# Patient Record
Sex: Female | Born: 1985 | Race: White | Hispanic: No | Marital: Married | Smoking: Never smoker
Health system: Southern US, Community
[De-identification: ages and names within clinical notes are randomized; demographics above are authoritative.]

## PROBLEM LIST (undated history)

## (undated) ENCOUNTER — Inpatient Hospital Stay (HOSPITAL_COMMUNITY): Payer: Self-pay

## (undated) DIAGNOSIS — Z9289 Personal history of other medical treatment: Secondary | ICD-10-CM

## (undated) DIAGNOSIS — J45909 Unspecified asthma, uncomplicated: Secondary | ICD-10-CM

## (undated) DIAGNOSIS — K219 Gastro-esophageal reflux disease without esophagitis: Secondary | ICD-10-CM

## (undated) DIAGNOSIS — E119 Type 2 diabetes mellitus without complications: Secondary | ICD-10-CM

## (undated) DIAGNOSIS — R011 Cardiac murmur, unspecified: Secondary | ICD-10-CM

## (undated) DIAGNOSIS — R51 Headache: Secondary | ICD-10-CM

## (undated) DIAGNOSIS — N189 Chronic kidney disease, unspecified: Secondary | ICD-10-CM

---
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
## 2012-03-27 LAB — OB RESULTS CONSOLE RPR: RPR: NONREACTIVE

## 2012-03-27 LAB — OB RESULTS CONSOLE HIV ANTIBODY (ROUTINE TESTING): HIV: NONREACTIVE

## 2012-03-27 LAB — OB RESULTS CONSOLE ABO/RH: RH Type: NEGATIVE

## 2012-04-09 NOTE — L&D Delivery Note (Signed)
Delivery Note  SVD viable female Apgars 9,9 over 2nd degree ml lac.  Placenta delivered spontaneously intact with 3VC. Repair with 2-0 and 3-0  Chromic with good support and hemostasis noted and R/V exam confirms.  PH art was sent.  [place] cord blood was not done.  Mother and baby were doing well.  EBL 350cc  Candice Camp, MD

## 2012-09-14 ENCOUNTER — Inpatient Hospital Stay (HOSPITAL_COMMUNITY)
Admission: AD | Admit: 2012-09-14 | Discharge: 2012-09-15 | Disposition: A | Discharge status: Home / Self Care | Payer: Managed Care, Other (non HMO) | Source: Ambulatory Visit | Attending: Obstetrics and Gynecology | Admitting: Obstetrics and Gynecology

## 2012-09-14 ENCOUNTER — Encounter (HOSPITAL_COMMUNITY): Payer: Self-pay | Admitting: *Deleted

## 2012-09-14 ENCOUNTER — Inpatient Hospital Stay (HOSPITAL_COMMUNITY): Payer: Managed Care, Other (non HMO)

## 2012-09-14 DIAGNOSIS — O99891 Other specified diseases and conditions complicating pregnancy: Secondary | ICD-10-CM | POA: Insufficient documentation

## 2012-09-14 DIAGNOSIS — N200 Calculus of kidney: Secondary | ICD-10-CM

## 2012-09-14 DIAGNOSIS — R109 Unspecified abdominal pain: Secondary | ICD-10-CM | POA: Insufficient documentation

## 2012-09-14 LAB — COMPREHENSIVE METABOLIC PANEL
Albumin: 2.5 g/dL — ABNORMAL LOW (ref 3.5–5.2)
Alkaline Phosphatase: 100 U/L (ref 39–117)
BUN: 9 mg/dL (ref 6–23)
CO2: 23 mEq/L (ref 19–32)
Chloride: 106 mEq/L (ref 96–112)
GFR calc non Af Amer: >90 mL/min (ref >90)
Potassium: 3.7 mEq/L (ref 3.5–5.1)
Total Bilirubin: 0.4 mg/dL (ref 0.3–1.2)

## 2012-09-14 LAB — URINALYSIS, ROUTINE W REFLEX MICROSCOPIC
Glucose, UA: NEGATIVE mg/dL
Hgb urine dipstick: NEGATIVE
Leukocytes, UA: NEGATIVE
Specific Gravity, Urine: 1.01 (ref 1.005–1.030)
pH: 6.5 (ref 5.0–8.0)

## 2012-09-14 LAB — CBC
HCT: 35.9 % — ABNORMAL LOW (ref 36.0–46.0)
Hemoglobin: 12.4 g/dL (ref 12.0–15.0)
MCV: 83.5 fL (ref 78.0–100.0)
RBC: 4.3 MIL/uL (ref 3.87–5.11)
RDW: 13.5 % (ref 11.5–15.5)
WBC: 11.1 10*3/uL — ABNORMAL HIGH (ref 4.0–10.5)

## 2012-09-14 MED ORDER — OXYCODONE-ACETAMINOPHEN 5-325 MG PO TABS: 1.0000 | ORAL_TABLET | ORAL | Status: DC | PRN

## 2012-09-14 MED ORDER — OXYCODONE-ACETAMINOPHEN 5-325 MG PO TABS
2.0000 | ORAL_TABLET | Freq: Once | ORAL | Status: AC
  Administered 2012-09-14: 2 via ORAL
  Filled 2012-09-14: qty 2

## 2012-09-14 MED ORDER — PROMETHAZINE HCL 25 MG PO TABS
12.5000 mg | ORAL_TABLET | Freq: Once | ORAL | Status: AC
  Administered 2012-09-14: 12.5 mg via ORAL
  Filled 2012-09-14: qty 1

## 2012-09-14 MED ORDER — FENTANYL CITRATE 0.05 MG/ML IJ SOLN
100.0000 ug | Freq: Once | INTRAMUSCULAR | Status: AC
  Administered 2012-09-14: 100 ug via INTRAMUSCULAR
  Filled 2012-09-14 (×2): qty 2

## 2012-09-14 MED ORDER — PROMETHAZINE HCL 12.5 MG PO TABS: 25.0000 mg | ORAL_TABLET | Freq: Four times a day (QID) | ORAL | Status: DC | PRN

## 2012-09-14 NOTE — MAU Provider Note (Signed)
History     CSN: [id number]  Arrival date and time: 09/14/12 2006   First Provider Initiated Contact with Patient 09/14/12 2045      Chief Complaint  Patient presents with  . Flank Pain  . Contractions   HPI  Judy Hart is a 27 y.o. G2P1 at [redacted]w[redacted]d who presents today with Right flank pain. She states that she had a kidney stone with her last pregnancy, and this pain is very similar. She also thinks she is having some contractions. She denies any LOF or VB and confirms fetal movement.    No past medical history on file.  No past surgical history on file.  No family history on file.  History  Substance Use Topics  . Smoking status: Not on file  . Smokeless tobacco: Not on file  . Alcohol Use: Not on file    Allergies: Allergies not on file  No prescriptions prior to admission    Review of Systems  Constitutional: Negative for fever and chills.  Eyes: Negative for blurred vision.  Respiratory: Negative for shortness of breath.   Cardiovascular: Negative for chest pain.  Gastrointestinal: Positive for nausea. Negative for vomiting, abdominal pain, diarrhea and constipation.  Genitourinary: Positive for flank pain. Negative for dysuria, urgency, frequency and hematuria.  Musculoskeletal: Negative for myalgias.  Neurological: Negative for headaches.   Physical Exam   Blood pressure 107/59, pulse 112, temperature 98.4 F (36.9 C), temperature source Oral, resp. rate 20, height 5\' 3"  (1.6 m), weight 69.4 kg (153 lb), SpO2 100.00%.  Physical Exam  Nursing note and vitals reviewed. Constitutional: She is oriented to person, place, and time. She appears well-developed and well-nourished. No distress.  Cardiovascular: Normal rate.   Respiratory: Effort normal.  GI: Soft. There is no tenderness.  Genitourinary:   No CVA tenderness External: no lesion Vagina: small amount of white discharge Cervix: pink, smooth, Closed/thick/high Uterus: AGA  Neurological: She is  alert and oriented to person, place, and time.  Skin: Skin is warm and dry.  Psychiatric: She has a normal mood and affect.    NST: FHT: Judy Hart Toco: some UI MAU Course  Procedures  Results for orders placed during the hospital encounter of 09/14/12 (from the past 24 hour(s))  URINALYSIS, ROUTINE W REFLEX MICROSCOPIC     Status: None   Collection Time    09/14/12  8:20 PM      Result Value Range   Color, Urine YELLOW  YELLOW   APPearance CLEAR  CLEAR   Specific Gravity, Urine 1.010  1.005 - 1.030   pH 6.5  5.0 - 8.0   Glucose, UA NEGATIVE  NEGATIVE mg/dL   Hgb urine dipstick NEGATIVE  NEGATIVE   Bilirubin Urine NEGATIVE  NEGATIVE   Ketones, ur NEGATIVE  NEGATIVE mg/dL   Protein, ur NEGATIVE  NEGATIVE mg/dL   Urobilinogen, UA 0.2  0.0 - 1.0 mg/dL   Nitrite NEGATIVE  NEGATIVE   Leukocytes, UA NEGATIVE  NEGATIVE  CBC     Status: Abnormal   Collection Time    09/14/12  9:00 PM      Result Value Range   WBC 11.1 (*) 4.0 - 10.5 K/uL   RBC 4.30  3.87 - 5.11 MIL/uL   Hemoglobin 12.4  12.0 - 15.0 g/dL   HCT 40.9 (*) 81.1 - 91.4 %   MCV 83.5  78.0 - 100.0 fL   MCH 28.8  26.0 - 34.0 pg   MCHC 34.5  30.0 - 36.0 g/dL  RDW 13.5  11.5 - 15.5 %   Platelets 179  150 - 400 K/uL  COMPREHENSIVE METABOLIC PANEL     Status: Abnormal   Collection Time    09/14/12  9:00 PM      Result Value Range   Sodium 138  135 - 145 mEq/L   Potassium 3.7  3.5 - 5.1 mEq/L   Chloride 106  96 - 112 mEq/L   CO2 23  19 - 32 mEq/L   Glucose, Bld 83  70 - 99 mg/dL   BUN 9  6 - 23 mg/dL   Creatinine, Ser 9.62  0.50 - 1.10 mg/dL   Calcium 9.1  8.4 - 95.2 mg/dL   Total Protein 5.9 (*) 6.0 - 8.3 g/dL   Albumin 2.5 (*) 3.5 - 5.2 g/dL   AST 28  0 - 37 U/L   ALT 43 (*) 0 - 35 U/L   Alkaline Phosphatase 100  39 - 117 U/L   Total Bilirubin 0.4  0.3 - 1.2 mg/dL   GFR calc non Af Amer >90  >90 mL/min   GFR calc Af Amer >90  >90 mL/min   *RADIOLOGY REPORT*  Clinical Data: Neck pain. History of kidney stones.  [redacted] weeks  pregnant.  RENAL/URINARY TRACT ULTRASOUND COMPLETE  Comparison: None.  Findings:  Right Kidney: Right kidney measures 13.4 cm length. There is  moderate right renal hydronephrosis. The proximal ureter is  dilated but the distal ureter is not visualized. The point of  obstruction is not identified. No specific stones are visualized.  No solid mass lesions.  Left Kidney: Normal in size and parenchymal echogenicity. No  evidence of mass or hydronephrosis.  Bladder: Limited visualization due to the pregnancy. No specific  abnormality identified as visualized.  IMPRESSION:  A nonspecific moderate right renal hydronephrosis. No stones  identified. Changes could be due to compression by the pregnancy  or to a non visualized obstructing stone or lesion.  Original Report Authenticated By: Burman Nieves, M.D.  2216: Spoke with Dr. Renaldo Fiddler, will try 2 percocet PO here. If unable to control pain with PO meds will admit for pain control. 2345: Patient reports that pain is adequately controlled with the percocet. Desires DC home with pain meds.  Assessment and Plan   1. Kidney stone complicating pregnancy, third trimester    RX: percocet 5/325 1-2 Q4-6hours PRN Phergan 25mg  PO q6 hours PRN Return to MAU if pain worsens or is not helped with meds Strain urine Increase PO hydration 3rd trimester danger signs reviewed FU with OB as scheduled.   Tawnya Crook 09/14/2012, 8:51 PM

## 2012-09-14 NOTE — MAU Note (Signed)
Pt reports rt flank pain since this am, h/o kidney stones. Vomiting. Contractions.

## 2012-09-19 ENCOUNTER — Ambulatory Visit (HOSPITAL_COMMUNITY): Payer: Managed Care, Other (non HMO) | Admitting: Anesthesiology

## 2012-09-19 ENCOUNTER — Other Ambulatory Visit: Payer: Self-pay | Admitting: Urology

## 2012-09-19 ENCOUNTER — Encounter (HOSPITAL_COMMUNITY): Payer: Self-pay | Admitting: Anesthesiology

## 2012-09-19 ENCOUNTER — Encounter (HOSPITAL_COMMUNITY)
Admission: AD | Disposition: A | Discharge status: Home / Self Care | Payer: Self-pay | Source: Ambulatory Visit | Attending: Obstetrics and Gynecology

## 2012-09-19 ENCOUNTER — Observation Stay (HOSPITAL_COMMUNITY)
Admission: AD | Admit: 2012-09-19 | Discharge: 2012-09-21 | Disposition: A | Discharge status: Home / Self Care | Payer: Managed Care, Other (non HMO) | Source: Ambulatory Visit | Attending: Obstetrics and Gynecology | Admitting: Obstetrics and Gynecology

## 2012-09-19 ENCOUNTER — Encounter (HOSPITAL_COMMUNITY): Payer: Self-pay | Admitting: *Deleted

## 2012-09-19 DIAGNOSIS — O26839 Pregnancy related renal disease, unspecified trimester: Principal | ICD-10-CM | POA: Insufficient documentation

## 2012-09-19 DIAGNOSIS — N200 Calculus of kidney: Secondary | ICD-10-CM | POA: Insufficient documentation

## 2012-09-19 DIAGNOSIS — O4700 False labor before 37 completed weeks of gestation, unspecified trimester: Secondary | ICD-10-CM | POA: Insufficient documentation

## 2012-09-19 DIAGNOSIS — N1330 Unspecified hydronephrosis: Secondary | ICD-10-CM | POA: Insufficient documentation

## 2012-09-19 DIAGNOSIS — N135 Crossing vessel and stricture of ureter without hydronephrosis: Secondary | ICD-10-CM | POA: Insufficient documentation

## 2012-09-19 DIAGNOSIS — R109 Unspecified abdominal pain: Secondary | ICD-10-CM | POA: Insufficient documentation

## 2012-09-19 HISTORY — DX: Headache: R51

## 2012-09-19 HISTORY — DX: Gastro-esophageal reflux disease without esophagitis: K21.9

## 2012-09-19 HISTORY — DX: Type 2 diabetes mellitus without complications: E11.9

## 2012-09-19 HISTORY — PX: CYSTOSCOPY W/ URETERAL STENT PLACEMENT: SHX1429

## 2012-09-19 HISTORY — DX: Chronic kidney disease, unspecified: N18.9

## 2012-09-19 HISTORY — DX: Unspecified asthma, uncomplicated: J45.909

## 2012-09-19 HISTORY — DX: Cardiac murmur, unspecified: R01.1

## 2012-09-19 LAB — COMPREHENSIVE METABOLIC PANEL
ALT: 26 U/L (ref 0–35)
Alkaline Phosphatase: 129 U/L — ABNORMAL HIGH (ref 39–117)
BUN: 6 mg/dL (ref 6–23)
CO2: 23 mEq/L (ref 19–32)
GFR calc Af Amer: >90 mL/min (ref >90)
GFR calc non Af Amer: >90 mL/min (ref >90)
Glucose, Bld: 137 mg/dL — ABNORMAL HIGH (ref 70–99)
Potassium: 3.6 mEq/L (ref 3.5–5.1)
Sodium: 136 mEq/L (ref 135–145)
Total Bilirubin: 0.7 mg/dL (ref 0.3–1.2)
Total Protein: 5.7 g/dL — ABNORMAL LOW (ref 6.0–8.3)

## 2012-09-19 LAB — CBC
HCT: 37.3 % (ref 36.0–46.0)
Hemoglobin: 11.1 g/dL — ABNORMAL LOW (ref 12.0–15.0)
MCHC: 29.8 g/dL — ABNORMAL LOW (ref 30.0–36.0)
RBC: 3.96 MIL/uL (ref 3.87–5.11)

## 2012-09-19 LAB — SURGICAL PCR SCREEN: MRSA, PCR: NEGATIVE

## 2012-09-19 SURGERY — CYSTOSCOPY, WITH RETROGRADE PYELOGRAM AND URETERAL STENT INSERTION
Anesthesia: General | Site: Ureter | Laterality: Right | Wound class: Clean Contaminated

## 2012-09-19 MED ORDER — LACTATED RINGERS IV SOLN: INTRAVENOUS | Status: DC

## 2012-09-19 MED ORDER — CYCLOBENZAPRINE HCL 10 MG PO TABS: 10.0000 mg | ORAL_TABLET | Freq: Three times a day (TID) | ORAL | Status: DC | PRN

## 2012-09-19 MED ORDER — LACTATED RINGERS IV SOLN
INTRAVENOUS | Status: DC | PRN
  Administered 2012-09-19: 19:00:00 via INTRAVENOUS

## 2012-09-19 MED ORDER — MUPIROCIN 2 % EX OINT
TOPICAL_OINTMENT | Freq: Two times a day (BID) | CUTANEOUS | Status: DC
  Administered 2012-09-19: 18:00:00 via NASAL
  Filled 2012-09-19: qty 22

## 2012-09-19 MED ORDER — MAGNESIUM SULFATE 40 G IN LACTATED RINGERS - SIMPLE
2.0000 g/h | INTRAVENOUS | Status: DC
  Filled 2012-09-19: qty 500

## 2012-09-19 MED ORDER — DEXAMETHASONE SODIUM PHOSPHATE 10 MG/ML IJ SOLN
INTRAMUSCULAR | Status: DC | PRN
  Administered 2012-09-19: 10 mg via INTRAVENOUS

## 2012-09-19 MED ORDER — ZOLPIDEM TARTRATE 5 MG PO TABS: 5.0000 mg | ORAL_TABLET | Freq: Every evening | ORAL | Status: DC | PRN

## 2012-09-19 MED ORDER — LACTATED RINGERS IV SOLN
INTRAVENOUS | Status: DC
  Administered 2012-09-20 – 2012-09-21 (×4): via INTRAVENOUS

## 2012-09-19 MED ORDER — 0.9 % SODIUM CHLORIDE (POUR BTL) OPTIME
TOPICAL | Status: DC | PRN
  Administered 2012-09-19: 1000 mL

## 2012-09-19 MED ORDER — CALCIUM CARBONATE ANTACID 500 MG PO CHEW: 2.0000 | CHEWABLE_TABLET | ORAL | Status: DC | PRN

## 2012-09-19 MED ORDER — CEFAZOLIN SODIUM-DEXTROSE 2-3 GM-% IV SOLR
INTRAVENOUS | Status: AC
  Filled 2012-09-19: qty 50

## 2012-09-19 MED ORDER — SUCCINYLCHOLINE CHLORIDE 20 MG/ML IJ SOLN
INTRAMUSCULAR | Status: DC | PRN
  Administered 2012-09-19: 120 mg via INTRAVENOUS

## 2012-09-19 MED ORDER — LIDOCAINE HCL (CARDIAC) 20 MG/ML IV SOLN
INTRAVENOUS | Status: DC | PRN
  Administered 2012-09-19: 50 mg via INTRAVENOUS

## 2012-09-19 MED ORDER — LIDOCAINE HCL 2 % EX GEL
CUTANEOUS | Status: AC
  Filled 2012-09-19: qty 10

## 2012-09-19 MED ORDER — IOHEXOL 300 MG/ML  SOLN
INTRAMUSCULAR | Status: AC
  Filled 2012-09-19: qty 1

## 2012-09-19 MED ORDER — SODIUM CHLORIDE 0.9 % IR SOLN
Status: DC | PRN
  Administered 2012-09-19: 3000 mL

## 2012-09-19 MED ORDER — CITRIC ACID-SODIUM CITRATE 334-500 MG/5ML PO SOLN
ORAL | Status: AC
  Filled 2012-09-19: qty 15

## 2012-09-19 MED ORDER — ACETAMINOPHEN 325 MG PO TABS
650.0000 mg | ORAL_TABLET | ORAL | Status: DC | PRN
  Administered 2012-09-20 (×2): 650 mg via ORAL
  Filled 2012-09-19 (×2): qty 2

## 2012-09-19 MED ORDER — CEFAZOLIN SODIUM 1-5 GM-% IV SOLN
1.0000 g | INTRAVENOUS | Status: AC
  Administered 2012-09-19: 2 g via INTRAVENOUS

## 2012-09-19 MED ORDER — FLUTICASONE PROPIONATE 50 MCG/ACT NA SUSP
2.0000 | Freq: Every day | NASAL | Status: DC
  Administered 2012-09-20 – 2012-09-21 (×2): 2 via NASAL
  Filled 2012-09-19: qty 16

## 2012-09-19 MED ORDER — DOCUSATE SODIUM 100 MG PO CAPS
100.0000 mg | ORAL_CAPSULE | Freq: Every day | ORAL | Status: DC
  Administered 2012-09-20: 100 mg via ORAL
  Filled 2012-09-19 (×2): qty 1

## 2012-09-19 MED ORDER — MAGNESIUM SULFATE BOLUS VIA INFUSION
4.0000 g | Freq: Once | INTRAVENOUS | Status: AC
  Administered 2012-09-19: 4 g via INTRAVENOUS
  Filled 2012-09-19: qty 500

## 2012-09-19 MED ORDER — FENTANYL CITRATE 0.05 MG/ML IJ SOLN
INTRAMUSCULAR | Status: DC | PRN
  Administered 2012-09-19: 50 ug via INTRAVENOUS
  Administered 2012-09-19 (×2): 100 ug via INTRAVENOUS

## 2012-09-19 MED ORDER — PRENATAL MULTIVITAMIN CH
1.0000 | ORAL_TABLET | Freq: Every day | ORAL | Status: DC
  Administered 2012-09-20 – 2012-09-21 (×2): 1 via ORAL
  Filled 2012-09-19 (×2): qty 1

## 2012-09-19 MED ORDER — PROPOFOL 10 MG/ML IV BOLUS
INTRAVENOUS | Status: DC | PRN
  Administered 2012-09-19: 200 mg via INTRAVENOUS

## 2012-09-19 MED ORDER — CITRIC ACID-SODIUM CITRATE 334-500 MG/5ML PO SOLN
ORAL | Status: DC | PRN
  Administered 2012-09-19: 30 mL via ORAL

## 2012-09-19 MED ORDER — FAMOTIDINE 20 MG PO TABS
20.0000 mg | ORAL_TABLET | Freq: Two times a day (BID) | ORAL | Status: DC
  Administered 2012-09-19 – 2012-09-20 (×2): 20 mg via ORAL
  Filled 2012-09-19 (×4): qty 1

## 2012-09-19 MED ORDER — ONDANSETRON HCL 4 MG/2ML IJ SOLN
INTRAMUSCULAR | Status: DC | PRN
  Administered 2012-09-19: 4 mg via INTRAVENOUS

## 2012-09-19 MED ORDER — TERBUTALINE SULFATE 1 MG/ML IJ SOLN
0.2500 mg | Freq: Once | INTRAMUSCULAR | Status: AC
  Administered 2012-09-19: 0.25 mg via SUBCUTANEOUS
  Filled 2012-09-19: qty 1

## 2012-09-19 MED ORDER — OXYCODONE-ACETAMINOPHEN 5-325 MG PO TABS: 1.0000 | ORAL_TABLET | ORAL | Status: DC | PRN

## 2012-09-19 MED ORDER — AMPICILLIN SODIUM 2 G IJ SOLR
2.0000 g | Freq: Four times a day (QID) | INTRAMUSCULAR | Status: DC
  Administered 2012-09-19 – 2012-09-21 (×6): 2 g via INTRAVENOUS
  Filled 2012-09-19 (×9): qty 2000

## 2012-09-19 SURGICAL SUPPLY — 20 items
ADAPTER CATH URET PLST 4-6FR (CATHETERS) IMPLANT
BAG URO CATCHER STRL LF (DRAPE) ×2 IMPLANT
CATH INTERMIT  6FR 70CM (CATHETERS) IMPLANT
CATH URET 5FR 28IN CONE TIP (BALLOONS)
CATH URET 5FR 70CM CONE TIP (BALLOONS) IMPLANT
CLOTH BEACON ORANGE TIMEOUT ST (SAFETY) ×2 IMPLANT
DRAPE CAMERA CLOSED 9X96 (DRAPES) ×2 IMPLANT
GLOVE BIOGEL M 7.0 STRL (GLOVE) ×2 IMPLANT
GLOVE SURG SS PI 7.5 STRL IVOR (GLOVE) ×2 IMPLANT
GOWN STRL NON-REIN LRG LVL3 (GOWN DISPOSABLE) ×2 IMPLANT
GOWN STRL REIN 2XL XLG LVL4 (GOWN DISPOSABLE) ×2 IMPLANT
GUIDEWIRE STR DUAL SENSOR (WIRE) ×2 IMPLANT
IV NS IRRIG 3000ML ARTHROMATIC (IV SOLUTION) ×2 IMPLANT
MANIFOLD NEPTUNE II (INSTRUMENTS) ×2 IMPLANT
MARKER SKIN DUAL TIP RULER LAB (MISCELLANEOUS) ×2 IMPLANT
NS IRRIG 1000ML POUR BTL (IV SOLUTION) ×4 IMPLANT
PACK CYSTO (CUSTOM PROCEDURE TRAY) ×2 IMPLANT
STENT POLARIS 5FRX24 (STENTS) ×2 IMPLANT
TUBING CONNECTING 10 (TUBING) ×2 IMPLANT
WIRE COONS/BENSON .038X145CM (WIRE) IMPLANT

## 2012-09-19 NOTE — Anesthesia Postprocedure Evaluation (Signed)
  Anesthesia Post-op Note  Patient: Judy Hart  Procedure(s) Performed: Procedure(s) (LRB): CYSTOSCOPY  /RIGHT URETERAL STENT PLACEMENT (Right)  Patient Location: PACU  Anesthesia Type: General  Level of Consciousness: awake and alert   Airway and Oxygen Therapy: Patient Spontanous Breathing  Post-op Pain: mild  Post-op Assessment: Post-op Vital signs reviewed, Patient's Cardiovascular Status Stable, Respiratory Function Stable, Patent Airway and No signs of Nausea or vomiting  Last Vitals:  Filed Vitals:   09/19/12 2000  BP: 117/76  Pulse: 120  Temp:   Resp: 22    Post-op Vital Signs: stable   Complications: Heart rate same as preop (118). Possible uterine contractions per [facility] RN. Olegario Messier has spoken to Dr. Henderson Cloud and the plan is to transfer to [facility] hospital by [facility] for observation. She will get an additional 500 cc IV bolus before transfer. FHT 144.

## 2012-09-19 NOTE — Anesthesia Preprocedure Evaluation (Addendum)
Anesthesia Evaluation  Patient identified by MRN, date of birth, ID band Patient awake  General Assessment Comment:1. History of  Asthma 493.90 2. History of  Esophageal Reflux 530.81 3. History of  Gestational Diabetes Mellitus 648.80 4. History of  Murmurs 785.2 5. History of  Nephrolithiasis V13.01   Reviewed: Allergy & Precautions, H&P , NPO status , Patient's Chart, lab work & pertinent test results  Airway Mallampati: II TM Distance: >3 FB Neck ROM: Full    Dental no notable dental hx.    Pulmonary neg pulmonary ROS, asthma ,  breath sounds clear to auscultation  Pulmonary exam normal       Cardiovascular Exercise Tolerance: Good negative cardio ROS  + Valvular Problems/Murmurs Rhythm:Regular Rate:Normal     Neuro/Psych negative neurological ROS  negative psych ROS   GI/Hepatic negative GI ROS, Neg liver ROS, GERD-  ,  Endo/Other  negative endocrine ROSdiabetes, Gestational  Renal/GU Renal diseasenegative Renal ROS  negative genitourinary   Musculoskeletal negative musculoskeletal ROS (+)   Abdominal   Peds negative pediatric ROS (+)  Hematology negative hematology ROS (+)   Anesthesia Other Findings   Reproductive/Obstetrics negative OB ROS (+) Pregnancy IUP at [redacted] weeks EGA                          Anesthesia Physical Anesthesia Plan  ASA: II and emergent  Anesthesia Plan: General   Post-op Pain Management:    Induction: Intravenous  Airway Management Planned: Oral ETT  Additional Equipment:   Intra-op Plan:   Post-operative Plan: Extubation in OR  Informed Consent: I have reviewed the patients History and Physical, chart, labs and discussed the procedure including the risks, benefits and alternatives for the proposed anesthesia with the patient or authorized representative who has indicated his/her understanding and acceptance.   Dental advisory given  Plan  Discussed with: CRNA  Anesthesia Plan Comments: (Discussed risks of general versus spinal anesthesia including preterm labor, teratogenicity.  Fetal monitoring is being provided by nurses from [facility]. Inetta Fermo RN has talked to Dr. Henderson Cloud, the referring physician and has received orders from him for perioperative monitoring according to hospital policy.)       Anesthesia Quick Evaluation

## 2012-09-19 NOTE — Progress Notes (Signed)
PACU Nursing Note: Pt crying, states having contractions, which are noted on monitoring device and confirmed by RN from [facility], fetal HR currently, 140/min, pt states pain originates in back and radiates to front, pain is in line with contraction wave forms noted on monitor and again confirmed by [facility] RN.

## 2012-09-19 NOTE — Transfer of Care (Signed)
Immediate Anesthesia Transfer of Care Note  Patient: Judy Hart  Procedure(s) Performed: Procedure(s): CYSTOSCOPY  /RIGHT URETERAL STENT PLACEMENT (Right)  Patient Location: PACU  Anesthesia Type:General  Level of Consciousness: awake, alert  and oriented  Airway & Oxygen Therapy: Patient Spontanous Breathing and Patient connected to face mask oxygen  Post-op Assessment: Report given to PACU RN and Post -op Vital signs reviewed and stable  Post vital signs: Reviewed and stable  Complications: No apparent anesthesia complications  FHR 144

## 2012-09-19 NOTE — Plan of Care (Signed)
Problem: Consults Goal: Birthing Suites Patient Information Press F2 to bring up selections list   Pt < [redacted] weeks EGA     

## 2012-09-19 NOTE — H&P (Signed)
History of Present Illness  Ms Casco is referred by Dr Harold Hedge.  She has been complaining of severe right flank pain since Sunday morning.  She is [redacted] weeks pregnant and has been taking oxycodone for pain.  The pain is constant and non radiating.  She had a kidney stone when she was pregnant last time 2 1/2 years ago.  The pain is the same as last time.  Renal ultrasound showed moderate right hydronephrosis without definite cause of obstruction.  She is still in pain.   Past Medical History Problems  1. History of  Asthma 493.90 2. History of  Esophageal Reflux 530.81 3. History of  Gestational Diabetes Mellitus 648.80 4. History of  Murmurs 785.2 5. History of  Nephrolithiasis V13.01  Surgical History Problems  1. History of  Oral Surgery Tooth Extraction  Current Meds 1. Amoxicillin 250 MG Oral Capsule; Therapy: 11Jun2014 to 2. Fluticasone Propionate 50 MCG/ACT Nasal Suspension; Therapy: 28May2014 to 3. Oxycodone-Acetaminophen 5-325 MG Oral Tablet; Therapy: 11Jun2014 to 4. ZyrTEC Allergy 10 MG Oral Tablet; Therapy: (Recorded:13Jun2014) to  Allergies Medication  1. Claritin TABS 2. Erythromycin TABS  Family History Problems  1. Maternal history of  Breast Cancer V16.3 2. Family history of  Breast Cancer V16.3 3. Family history of  Diabetes Mellitus V18.0 4. Family history of  Family Health Status - Mother's Age 64. Family history of  Family Health Status Number Of Children 6. Family history of  Father Deceased At Age 83  Social History Problems    Caffeine Use   Marital History - Currently Married   Never A Smoker Denied    History of  Alcohol Use  Review of Systems Genitourinary, constitutional, skin, eye, otolaryngeal, hematologic/lymphatic, cardiovascular, pulmonary, endocrine, musculoskeletal, gastrointestinal, neurological and psychiatric system(s) were reviewed and pertinent findings if present are noted.  Genitourinary: nocturia, incontinence and  hematuria.  Gastrointestinal: nausea, vomiting, heartburn and constipation.  Constitutional: feeling tired (fatigue).  Cardiovascular: leg swelling.  Respiratory: shortness of breath.  Musculoskeletal: back pain.  Neurological: dizziness.    Vitals Vital Signs [Data Includes: Last 1 Day]  13Jun2014 01:26PM  BMI Calculated: 27.29 BSA Calculated: 1.73 Height: 5 ft 3 in Weight: 154 lb  Blood Pressure: 123 / 84 Heart Rate: 102  Physical Exam Constitutional: Well nourished and well developed . No acute distress.  ENT:. The ears and nose are normal in appearance.  Neck: The appearance of the neck is normal and no neck mass is present.  Pulmonary: No respiratory distress and normal respiratory rhythm and effort.  Cardiovascular: Heart rate and rhythm are normal . No peripheral edema.  Abdomen: The abdomen is gravid. The abdomen is soft and nontender. No masses are palpated. Moderate tenderness in the RUQ is present. No CVA tenderness. No hernias are palpable. No hepatosplenomegaly noted.  Genitourinary:  The adnexa are palpably normal. The bladder is non tender and not distended.  Lymphatics: The femoral and inguinal nodes are not enlarged or tender.  Skin: Normal skin turgor, no visible rash and no visible skin lesions.  Neuro/Psych:. Mood and affect are appropriate.    Results/Data Urine [Data Includes: Last 1 Day]   13Jun2014  COLOR YELLOW   APPEARANCE CLEAR   SPECIFIC GRAVITY <1.005   pH 6.5   GLUCOSE NEG mg/dL  BILIRUBIN NEG   KETONE NEG mg/dL  BLOOD SMALL   PROTEIN NEG mg/dL  UROBILINOGEN 1 mg/dL  NITRITE NEG   LEUKOCYTE ESTERASE NEG   SQUAMOUS EPITHELIAL/HPF MODERATE   WBC 0-2 WBC/hpf  RBC 3-6 RBC/hpf  BACTERIA FEW   CRYSTALS NONE SEEN   CASTS NONE SEEN    Assessment Assessed  1. Hydronephrosis On The Right 591  Plan Health Maintenance (V70.0)  1. UA With REFLEX  Done: 13Jun2014 01:05PM   Cystoscopy, stent placement.  The procedure, risks, benefits were  explained to the patient and her husband.  The risks include but are not limited to hemorrhage, infection, ureteral injury.  They understand and are agreeable.   Signatures  CC: Dr Harold Hedge  Electronically signed by : Su Grand, M.D.; Sep 19 2012  3:52PM

## 2012-09-19 NOTE — H&P (Signed)
Judy Hart is a 27 y.o. female presenting for treatment of preterm labor. Pregnancy complicated by history of kidney stone, PP endometritis, PP blood transfusion, GDM and neonatal NICU admission for infection with pregnancy #1. This pregancy with GDM diet control. Also onset of right flank pain about 1 week ago. Pain progressively worse. I saw her in office 2 days ago and noted right CVAT and a normal urine dipstick. Treated with ampicillin, flexeril prn and percocet prn. Urine C&S pending. U/S in office noted diminished right ureteral jet. Urology consult felt most C/W un visualized kidney stone. Patient wanted pain relief and was taken to OR this pm for placement of right ureteral stent. In PACU noted to have onset of progressively worse UCs that became regular and painful. cx was closed last Sunday. Today in PACU, OB nurse check C/W 1 cm dilation at internal cervical os. Patient given IV fluid bolus, SQ terbutaline and transferred to [facility] antenatal unit. In PACU there was a question of a small leak of AF. No bleeding. No HA or vision change. No epigastric pain. Maternal Medical History:  Contractions: Onset was 3-5 hours ago.   Frequency: regular.   Perceived severity is moderate.    Fetal activity: Perceived fetal activity is normal.      OB History   Grav Para Term Preterm Abortions TAB SAB Ect Mult Living   2 1 1       1      Past Medical History  Diagnosis Date  . Chronic kidney disease     kidney stones  . Asthma     childhood asthma  . Heart murmur     found with first pregnancy  . Diabetes mellitus without complication     gestational diabetes with 2 pregnancies  . GERD (gastroesophageal reflux disease)   . Headache(784.0)     rare migraines  . Anemia 2011    two blood transfusions with 1st baby   Past Surgical History  Procedure Laterality Date  . Wisdom tooth extraction  2003   Family History: family history is not on file. Social History:  reports that she has  been passively smoking.  She has never used smokeless tobacco. She reports that she does not drink alcohol or use illicit drugs.   Prenatal Transfer Tool  Maternal Diabetes: Yes:  Diabetes Type:  Diet controlled Genetic Screening: Normal Maternal Ultrasounds/Referrals: Normal Fetal Ultrasounds or other Referrals:  None Maternal Substance Abuse:  No Significant Maternal Medications:  Meds include: Other: flexeril prn, percocet prn flank pain Significant Maternal Lab Results:  None Other Comments:  None  Review of Systems  Constitutional: Negative for fever.  Eyes: Negative for blurred vision.  Neurological: Negative for headaches.    Dilation: 1.5 Effacement (%): 40 Station: -3 Exam by:: Alla German RN Blood pressure 107/58, pulse 119, temperature 98.5 F (36.9 C), temperature source Oral, resp. rate 20, height 5\' 3"  (1.6 m), weight 153 lb 9.6 oz (69.673 kg), SpO2 99.00%. Maternal Exam:  Uterine Assessment: Contraction strength is moderate.  Contraction frequency is regular.      Fetal Exam Fetal Monitor Review: Pattern: accelerations present.       Physical Exam  Cardiovascular: Normal rate and regular rhythm.   Respiratory: Effort normal and breath sounds normal.  GI: Soft. There is no tenderness.  Neurological: She has normal reflexes.   SSE-no pool, fern negative Prenatal labs: ABO, Rh:   Antibody:   Rubella:   RPR:    HBsAg:    HIV:  GBS:     Assessment/Plan: 27 yo G2P1 with PTL, states feeling a little better Partial right ureteral obstruction, probable kidney stone, right ureteral stent GBBS + GDM diet control Magnesium sulfate tocolysis U/S in am  D/W patient/husband above     Raneshia Derick II,Nikesh Teschner E 09/19/2012, 11:16 PM

## 2012-09-19 NOTE — Progress Notes (Signed)
PACU Nursing Note: verbal bedside report given to [facility] team, copy of paperwork provided to team

## 2012-09-19 NOTE — Op Note (Signed)
Judy Hart is a 27 y.o.   09/19/2012  General  Preop diagnosis: Right hydronephrosis, pregnancy, possible ureteral stone  Postop diagnosis: Same  Procedure done: Cystoscopy, insertion of right double-J stent  Anesthesia: General  Surgeon: Wendie Simmer. Yelitza Reach  Indication: Patient is a 28 years old female, [redacted] weeks pregnant who has been complaining of right-sided abdominal pain for the past 5 days. She has been taking pain medication since to relieve the pain. Renal ultrasound 2 days ago showed moderate right hydronephrosis. The left kidney was normal. The cause of the hydronephrosis on the right side is not identified. She wants to get off pain medication. She is scheduled for right double-J stent placement.  Procedure: Patient was identified by her wrist band and proper timeout was taken.  Under general anesthesia she was prepped and draped and placed in the dorsolithotomy position. A pelvic shield was utilized to shield the baby. The right side was slanted to relieve pressure on the major blood vessels.  A panendoscope was inserted in the bladder. The bladder mucosa is normal. There is no stone or tumor in the bladder. The ureteral orifices are in normal position and shape. A sensor wire was passed through the cystoscope and the right ureteral orifice and advanced up to the renal pelvis. At this point efflux was noted coming out of the right ureteral orifice after passage of the sensor wire. A #5 French-24 Polaris stent was passed over the sensor wire. The loops of the sensor wire were in the bladder. At this point the sensor wire was removed. A spot film was then obtained and the proximal curl of the stent is in the collecting system. The bladder was then emptied and the cystoscope removed.  Patient tolerated the procedure well and left the OR in satisfactory condition to postanesthesia care unit.  CC Dr. Harold Hedge

## 2012-09-20 ENCOUNTER — Observation Stay (HOSPITAL_COMMUNITY): Payer: Managed Care, Other (non HMO)

## 2012-09-20 LAB — GLUCOSE, CAPILLARY
Glucose-Capillary: 81 mg/dL (ref 70–99)
Glucose-Capillary: 108 mg/dL — ABNORMAL HIGH (ref 70–99)
Glucose-Capillary: 97 mg/dL (ref 70–99)
Glucose-Capillary: 134 mg/dL — ABNORMAL HIGH (ref 70–99)

## 2012-09-20 MED ORDER — MAGNESIUM SULFATE 40 G IN LACTATED RINGERS - SIMPLE
2.0000 g/h | INTRAVENOUS | Status: DC
  Filled 2012-09-20: qty 500

## 2012-09-20 NOTE — Plan of Care (Signed)
Problem: Consults Goal: Birthing Suites Patient Information Press F2 to bring up selections list   Pt < [redacted] weeks EGA     

## 2012-09-20 NOTE — Progress Notes (Signed)
33 6/7 weeks Had increasing pain at urethra overnight, then urinary incontinence Stent was presenting at urethral meatus-Dr Nesi evaluated patient and apparently tried to slide stent back in unsuccessfully. He then removed stent-his note pending. Patient now says no bladder or flank pain UCs feel milder, no vaginal bleeding  VSS Afeb FHT reactive UCs about 6-7/hr DTR 2+  U/S today vtx, normal AFI, cx length 3.9 cm  Magnesium sulfate at 2 gm/hr Ampicillin IV  A: Partial right ureteral obstruction-S/P stent now removed      PTL   P: Stent recently removed      Continue magnesium until UCs space out more      Continue ampicillin      D/W patient and husband above      Will check EFW and kidney this week in office

## 2012-09-20 NOTE — Progress Notes (Signed)
U/S tech finishing up the OB limited and Dr Brunilda Payor in to see pt and attempt to slide the stent back into place and pt unable to tolerate the discomfort.  MD eventually pulling stent out as previously discussed before procedure started.

## 2012-09-20 NOTE — Progress Notes (Signed)
Pt up to void at 0315. Pt c/o leaking fluid after voiding. Once pt got back in bed I examined her perineum and found a white, coiled up "string" hanging out. Pt c/o pain and is not able to close her legs due to this pain. Dr Brunilda Payor was paged and given report on findings and pt's complaints. Dr Brunilda Payor states he will be in to see the pt in the morning.

## 2012-09-20 NOTE — Progress Notes (Signed)
Patient ID: Judy Hart, female   DOB: [birth year], 27 y.o.   MRN: [id number]   No abdominal or flank pain.  Voids well.  Urine slightly bloody.  Not unusual after stent placement. Has not passed a stone. Continue to strain all urine. Will see her again as needed.

## 2012-09-20 NOTE — Progress Notes (Signed)
Pt requesting to eat breakfast

## 2012-09-20 NOTE — Progress Notes (Signed)
Patient ID: Judy Hart, female   DOB: 18-Apr-1985, 27 y.o.   MRN: [id number]   [name] had right Polaris 5-24 JJ stent last evening for right flank pain and hydronephrosis.  After she started voiding the loops of the stent came out of the urethra.  Then the distal end of the stent was visible through the urethral meatus.  She also became incontinent of urine as expected with the stent through the urethra.  I attempted to push the stent back in the bladder.  The patient was uncomfortable and I removed the stent without any difficulty.  Hopefully the stone was dislodged and she will remain pain free.  Will monitor.

## 2012-09-21 LAB — GLUCOSE, CAPILLARY: Glucose-Capillary: 73 mg/dL (ref 70–99)

## 2012-09-21 NOTE — Discharge Summary (Signed)
Physician Discharge Summary  Patient ID: Judy Hart MRN: [id number] DOB/AGE: 05/31/1985 27 y.o.  Admit date: 09/19/2012 Discharge date: 09/21/2012  Admission Diagnoses:preterm labor, kidney stone  Discharge Diagnoses: same Active Problems:   * No active hospital problems. *   Discharged Condition: good  Hospital Course: admitted with strong UCs following right stent placement with Dr Brunilda Payor for right kidney stone and pain. Treated with magnesium sulfate. The stent slipped from meatus and Dr Brunilda Payor removed it yesterday. Magnesium continued until yesterday afternoon. After magnesium stopped, UCs about 3-5/hour mild to moderate to palpation. Cervix FT at internal os and thick. U/S showed normal AFI and vertex. This am feels good.  Consults: urology  Significant Diagnostic Studies: labs:  Results for orders placed during the hospital encounter of 09/19/12 (from the past 24 hour(s))  GLUCOSE, CAPILLARY     Status: Abnormal   Collection Time    09/20/12 12:19 PM      Result Value Range   Glucose-Capillary 108 (*) 70 - 99 mg/dL   Comment 1 Documented in Chart     Comment 2 Notify RN    GLUCOSE, CAPILLARY     Status: None   Collection Time    09/20/12  2:55 PM      Result Value Range   Glucose-Capillary 97  70 - 99 mg/dL   Comment 1 Documented in Chart     Comment 2 Notify RN    GLUCOSE, CAPILLARY     Status: Abnormal   Collection Time    09/20/12  8:42 PM      Result Value Range   Glucose-Capillary 134 (*) 70 - 99 mg/dL  GLUCOSE, CAPILLARY     Status: None   Collection Time    09/21/12  6:16 AM      Result Value Range   Glucose-Capillary 73  70 - 99 mg/dL    Treatments: IV hydration, antibiotics: ampicillin and magnesium sulfate  Discharge Exam: Blood pressure 116/69, pulse 74, temperature 97.8 F (36.6 C), temperature source Oral, resp. rate 18, height 5\' 3"  (1.6 m), weight 153 lb 9.6 oz (69.673 kg), SpO2 99.00%. General appearance: alert, cooperative and no  distress GI: soft, non-tender; bowel sounds normal; no masses,  no organomegaly Pelvic: cx FT/thick/high  Disposition: 01-Home or Self Care     Medication List    TAKE these medications       acetaminophen 500 MG tablet  Commonly known as:  TYLENOL  Take 500 mg by mouth every 6 (six) hours as needed for pain.     amoxicillin 250 MG capsule  Commonly known as:  AMOXIL  Take 250 mg by mouth 3 (three) times daily.     calcium carbonate 500 MG chewable tablet  Commonly known as:  TUMS - dosed in mg elemental calcium  Chew 1 tablet by mouth daily.     cetirizine 10 MG tablet  Commonly known as:  ZYRTEC  Take 10 mg by mouth daily.     cyclobenzaprine 10 MG tablet  Commonly known as:  FLEXERIL  Take 10 mg by mouth 3 (three) times daily as needed for muscle spasms.     fluticasone 50 MCG/ACT nasal spray  Commonly known as:  FLONASE  Place 2 sprays into the nose daily.     oxyCODONE-acetaminophen 5-325 MG per tablet  Commonly known as:  ROXICET  Take 1-2 tablets by mouth every 4 (four) hours as needed for pain.     prenatal vitamin w/FE, FA 27-1 MG Tabs  Take 1 tablet by mouth daily at 12 noon.     promethazine 12.5 MG tablet  Commonly known as:  PHENERGAN  Take 2 tablets (25 mg total) by mouth every 6 (six) hours as needed for nausea.           Follow-up Information   Follow up with NESI,MARC-HENRY, MD In 2 months.   Contact information:   [address] [place] [address] [phone]       Signed: Leslie Andrea 09/21/2012, 9:50 AM

## 2012-09-21 NOTE — Progress Notes (Signed)
34 0/7 weeks Feels better, passed kidney stone, feels some UCs, no bleeding/leaking  VSS Afeb Ut soft and NT Cx FT int os/th/high FHT reactive UCs 3-5 / hour  A: Rt kidney stone-resolved     PTL resolved  P: D/C home     Pelvic rest, no heavy lifting/fluids     FU office 3-5 days     [facility]

## 2012-09-21 NOTE — Progress Notes (Signed)
Monitor off after MD in to see pt and give discharge instructions

## 2012-09-22 ENCOUNTER — Encounter (HOSPITAL_COMMUNITY): Payer: Self-pay | Admitting: Urology

## 2012-09-25 LAB — STONE ANALYSIS: Stone Weight KSTONE: 0.001 g

## 2012-10-09 ENCOUNTER — Encounter (HOSPITAL_COMMUNITY): Payer: Self-pay | Admitting: *Deleted

## 2012-10-09 ENCOUNTER — Inpatient Hospital Stay (HOSPITAL_COMMUNITY)
Admission: AD | Admit: 2012-10-09 | Discharge: 2012-10-09 | Disposition: A | Discharge status: Home / Self Care | Payer: Managed Care, Other (non HMO) | Source: Ambulatory Visit | Attending: Obstetrics and Gynecology | Admitting: Obstetrics and Gynecology

## 2012-10-09 DIAGNOSIS — O4700 False labor before 37 completed weeks of gestation, unspecified trimester: Secondary | ICD-10-CM | POA: Insufficient documentation

## 2012-10-09 NOTE — MAU Note (Signed)
Pt states these contractions started about 10pm

## 2012-10-09 NOTE — MAU Note (Signed)
Pt returned to room after ambulating x1 hour and placed back on monitor cervix rechecked

## 2012-10-23 ENCOUNTER — Encounter (HOSPITAL_COMMUNITY): Payer: Self-pay | Admitting: *Deleted

## 2012-10-23 ENCOUNTER — Encounter (HOSPITAL_COMMUNITY): Payer: Self-pay | Admitting: Anesthesiology

## 2012-10-23 ENCOUNTER — Inpatient Hospital Stay (HOSPITAL_COMMUNITY)
Admission: AD | Admit: 2012-10-23 | Discharge: 2012-10-24 | DRG: 775 | Disposition: A | Discharge status: Home / Self Care | Payer: Managed Care, Other (non HMO) | Source: Ambulatory Visit | Attending: Obstetrics and Gynecology | Admitting: Obstetrics and Gynecology

## 2012-10-23 ENCOUNTER — Inpatient Hospital Stay (HOSPITAL_COMMUNITY): Payer: Managed Care, Other (non HMO) | Admitting: Anesthesiology

## 2012-10-23 DIAGNOSIS — N189 Chronic kidney disease, unspecified: Secondary | ICD-10-CM | POA: Diagnosis present

## 2012-10-23 DIAGNOSIS — O26839 Pregnancy related renal disease, unspecified trimester: Secondary | ICD-10-CM | POA: Diagnosis present

## 2012-10-23 DIAGNOSIS — Z22330 Carrier of Group B streptococcus: Secondary | ICD-10-CM

## 2012-10-23 DIAGNOSIS — O99814 Abnormal glucose complicating childbirth: Secondary | ICD-10-CM | POA: Diagnosis present

## 2012-10-23 DIAGNOSIS — O99892 Other specified diseases and conditions complicating childbirth: Secondary | ICD-10-CM | POA: Diagnosis present

## 2012-10-23 HISTORY — DX: Personal history of other medical treatment: Z92.89

## 2012-10-23 LAB — CBC
HCT: 35.9 % — ABNORMAL LOW (ref 36.0–46.0)
Hemoglobin: 11.9 g/dL — ABNORMAL LOW (ref 12.0–15.0)
MCH: 26.5 pg (ref 26.0–34.0)
MCHC: 33.1 g/dL (ref 30.0–36.0)
MCV: 80 fL (ref 78.0–100.0)
RBC: 4.49 MIL/uL (ref 3.87–5.11)

## 2012-10-23 LAB — RPR: RPR Ser Ql: NONREACTIVE

## 2012-10-23 MED ORDER — IBUPROFEN 600 MG PO TABS
600.0000 mg | ORAL_TABLET | Freq: Four times a day (QID) | ORAL | Status: DC | PRN
  Administered 2012-10-23: 600 mg via ORAL
  Filled 2012-10-23: qty 1

## 2012-10-23 MED ORDER — IBUPROFEN 600 MG PO TABS
600.0000 mg | ORAL_TABLET | Freq: Four times a day (QID) | ORAL | Status: DC
  Administered 2012-10-24 (×3): 600 mg via ORAL
  Filled 2012-10-23 (×3): qty 1

## 2012-10-23 MED ORDER — DIPHENHYDRAMINE HCL 50 MG/ML IJ SOLN: 12.5000 mg | INTRAMUSCULAR | Status: DC | PRN

## 2012-10-23 MED ORDER — PANTOPRAZOLE SODIUM 40 MG PO TBEC
40.0000 mg | DELAYED_RELEASE_TABLET | Freq: Every day | ORAL | Status: DC | PRN
  Filled 2012-10-23: qty 1

## 2012-10-23 MED ORDER — WITCH HAZEL-GLYCERIN EX PADS: 1.0000 "application " | MEDICATED_PAD | CUTANEOUS | Status: DC | PRN

## 2012-10-23 MED ORDER — OXYCODONE-ACETAMINOPHEN 5-325 MG PO TABS: 1.0000 | ORAL_TABLET | ORAL | Status: DC | PRN

## 2012-10-23 MED ORDER — LIDOCAINE HCL (PF) 1 % IJ SOLN
30.0000 mL | INTRAMUSCULAR | Status: AC | PRN
  Administered 2012-10-23 (×2): 9 mL via SUBCUTANEOUS

## 2012-10-23 MED ORDER — BENZOCAINE-MENTHOL 20-0.5 % EX AERO
1.0000 "application " | INHALATION_SPRAY | CUTANEOUS | Status: DC | PRN
  Administered 2012-10-23: 1 via TOPICAL
  Filled 2012-10-23: qty 56

## 2012-10-23 MED ORDER — EPHEDRINE 5 MG/ML INJ
10.0000 mg | INTRAVENOUS | Status: DC | PRN
  Filled 2012-10-23: qty 2

## 2012-10-23 MED ORDER — LACTATED RINGERS IV SOLN
INTRAVENOUS | Status: DC
  Administered 2012-10-23: 14:00:00 via INTRAVENOUS

## 2012-10-23 MED ORDER — ONDANSETRON HCL 4 MG/2ML IJ SOLN: 4.0000 mg | Freq: Four times a day (QID) | INTRAMUSCULAR | Status: DC | PRN

## 2012-10-23 MED ORDER — ONDANSETRON HCL 4 MG/2ML IJ SOLN: 4.0000 mg | INTRAMUSCULAR | Status: DC | PRN

## 2012-10-23 MED ORDER — ZOLPIDEM TARTRATE 5 MG PO TABS: 5.0000 mg | ORAL_TABLET | Freq: Every evening | ORAL | Status: DC | PRN

## 2012-10-23 MED ORDER — AMPICILLIN SODIUM 2 G IJ SOLR
2.0000 g | Freq: Four times a day (QID) | INTRAMUSCULAR | Status: DC
  Administered 2012-10-23: 2 g via INTRAVENOUS
  Filled 2012-10-23 (×3): qty 2000

## 2012-10-23 MED ORDER — LACTATED RINGERS IV SOLN: 500.0000 mL | Freq: Once | INTRAVENOUS | Status: DC

## 2012-10-23 MED ORDER — CITRIC ACID-SODIUM CITRATE 334-500 MG/5ML PO SOLN: 30.0000 mL | ORAL | Status: DC | PRN

## 2012-10-23 MED ORDER — PHENYLEPHRINE 40 MCG/ML (10ML) SYRINGE FOR IV PUSH (FOR BLOOD PRESSURE SUPPORT)
80.0000 ug | PREFILLED_SYRINGE | INTRAVENOUS | Status: DC | PRN
  Filled 2012-10-23: qty 2
  Filled 2012-10-23: qty 5

## 2012-10-23 MED ORDER — OXYTOCIN 40 UNITS IN LACTATED RINGERS INFUSION - SIMPLE MED
62.5000 mL/h | INTRAVENOUS | Status: DC
  Administered 2012-10-23: 62.5 mL/h via INTRAVENOUS

## 2012-10-23 MED ORDER — DIPHENHYDRAMINE HCL 25 MG PO CAPS: 25.0000 mg | ORAL_CAPSULE | Freq: Four times a day (QID) | ORAL | Status: DC | PRN

## 2012-10-23 MED ORDER — PRENATAL MULTIVITAMIN CH: 1.0000 | ORAL_TABLET | Freq: Every day | ORAL | Status: DC

## 2012-10-23 MED ORDER — SENNOSIDES-DOCUSATE SODIUM 8.6-50 MG PO TABS: 2.0000 | ORAL_TABLET | Freq: Every day | ORAL | Status: DC

## 2012-10-23 MED ORDER — OXYTOCIN BOLUS FROM INFUSION: 500.0000 mL | INTRAVENOUS | Status: DC

## 2012-10-23 MED ORDER — FENTANYL 2.5 MCG/ML BUPIVACAINE 1/10 % EPIDURAL INFUSION (WH - ANES)
14.0000 mL/h | INTRAMUSCULAR | Status: DC | PRN
  Filled 2012-10-23: qty 125

## 2012-10-23 MED ORDER — EPHEDRINE 5 MG/ML INJ
10.0000 mg | INTRAVENOUS | Status: DC | PRN
  Filled 2012-10-23: qty 2
  Filled 2012-10-23: qty 4

## 2012-10-23 MED ORDER — ONDANSETRON HCL 4 MG PO TABS: 4.0000 mg | ORAL_TABLET | ORAL | Status: DC | PRN

## 2012-10-23 MED ORDER — TERBUTALINE SULFATE 1 MG/ML IJ SOLN: 0.2500 mg | Freq: Once | INTRAMUSCULAR | Status: DC | PRN

## 2012-10-23 MED ORDER — FLEET ENEMA 7-19 GM/118ML RE ENEM: 1.0000 | ENEMA | RECTAL | Status: DC | PRN

## 2012-10-23 MED ORDER — MEASLES, MUMPS & RUBELLA VAC ~~LOC~~ INJ: 0.5000 mL | INJECTION | Freq: Once | SUBCUTANEOUS | Status: DC

## 2012-10-23 MED ORDER — MEDROXYPROGESTERONE ACETATE 150 MG/ML IM SUSP: 150.0000 mg | INTRAMUSCULAR | Status: DC | PRN

## 2012-10-23 MED ORDER — SIMETHICONE 80 MG PO CHEW: 80.0000 mg | CHEWABLE_TABLET | ORAL | Status: DC | PRN

## 2012-10-23 MED ORDER — ACETAMINOPHEN 325 MG PO TABS: 650.0000 mg | ORAL_TABLET | ORAL | Status: DC | PRN

## 2012-10-23 MED ORDER — OXYTOCIN 40 UNITS IN LACTATED RINGERS INFUSION - SIMPLE MED
1.0000 m[IU]/min | INTRAVENOUS | Status: DC
  Administered 2012-10-23: 2 m[IU]/min via INTRAVENOUS
  Filled 2012-10-23: qty 1000

## 2012-10-23 MED ORDER — LIDOCAINE HCL (PF) 1 % IJ SOLN
INTRAMUSCULAR | Status: AC
  Filled 2012-10-23: qty 30

## 2012-10-23 MED ORDER — FENTANYL 2.5 MCG/ML BUPIVACAINE 1/10 % EPIDURAL INFUSION (WH - ANES)
INTRAMUSCULAR | Status: DC | PRN
  Administered 2012-10-23: 14 mL/h via EPIDURAL

## 2012-10-23 MED ORDER — DIBUCAINE 1 % RE OINT
1.0000 "application " | TOPICAL_OINTMENT | RECTAL | Status: DC | PRN
  Administered 2012-10-23: 1 via RECTAL
  Filled 2012-10-23: qty 28

## 2012-10-23 MED ORDER — TETANUS-DIPHTH-ACELL PERTUSSIS 5-2.5-18.5 LF-MCG/0.5 IM SUSP: 0.5000 mL | Freq: Once | INTRAMUSCULAR | Status: DC

## 2012-10-23 MED ORDER — LACTATED RINGERS IV SOLN: 500.0000 mL | INTRAVENOUS | Status: DC | PRN

## 2012-10-23 MED ORDER — LANOLIN HYDROUS EX OINT: TOPICAL_OINTMENT | CUTANEOUS | Status: DC | PRN

## 2012-10-23 MED ORDER — SODIUM CHLORIDE 0.9 % IV SOLN: 1.0000 g | Freq: Four times a day (QID) | INTRAVENOUS | Status: DC

## 2012-10-23 MED ORDER — PHENYLEPHRINE 40 MCG/ML (10ML) SYRINGE FOR IV PUSH (FOR BLOOD PRESSURE SUPPORT)
80.0000 ug | PREFILLED_SYRINGE | INTRAVENOUS | Status: DC | PRN
  Filled 2012-10-23: qty 2

## 2012-10-23 NOTE — Anesthesia Preprocedure Evaluation (Signed)
Anesthesia Evaluation  Patient identified by MRN, date of birth, ID band Patient awake    Reviewed: Allergy & Precautions, H&P , NPO status , Patient's Chart, lab work & pertinent test results  Airway Mallampati: I TM Distance: >3 FB Neck ROM: full    Dental no notable dental hx.    Pulmonary    Pulmonary exam normal       Cardiovascular negative cardio ROS      Neuro/Psych negative psych ROS   GI/Hepatic Neg liver ROS, GERD-  Medicated and Controlled,  Endo/Other  negative endocrine ROSdiabetes, Gestational  Renal/GU   negative genitourinary   Musculoskeletal negative musculoskeletal ROS (+)   Abdominal Normal abdominal exam  (+)   Peds negative pediatric ROS (+)  Hematology negative hematology ROS (+)   Anesthesia Other Findings   Reproductive/Obstetrics (+) Pregnancy                           Anesthesia Physical Anesthesia Plan  ASA: II  Anesthesia Plan: Epidural   Post-op Pain Management:    Induction:   Airway Management Planned:   Additional Equipment:   Intra-op Plan:   Post-operative Plan:   Informed Consent: I have reviewed the patients History and Physical, chart, labs and discussed the procedure including the risks, benefits and alternatives for the proposed anesthesia with the patient or authorized representative who has indicated his/her understanding and acceptance.     Plan Discussed with:   Anesthesia Plan Comments:         Anesthesia Quick Evaluation

## 2012-10-23 NOTE — H&P (Signed)
Judy Hart is a 27 y.o. female presenting for labor sxs.  In office 5 cm with regular ctxs.  GBS + Gest DM diet controlled. History OB History   Grav Para Term Preterm Abortions TAB SAB Ect Mult Living   2 1 1       1      Past Medical History  Diagnosis Date  . Chronic kidney disease     kidney stones  . Asthma     childhood asthma  . Heart murmur     found with first pregnancy  . Diabetes mellitus without complication     gestational diabetes with 2 pregnancies  . GERD (gastroesophageal reflux disease)   . Headache(784.0)     rare migraines  . Transfusion history     2 transfusions postpartum last pregnancy   Past Surgical History  Procedure Laterality Date  . Wisdom tooth extraction  2003  . Cystoscopy w/ ureteral stent placement Right 09/19/2012    Procedure: CYSTOSCOPY  /RIGHT URETERAL STENT PLACEMENT;  Surgeon: Lindaann Slough, MD;  Location: [facility];  Service: Urology;  Laterality: Right;   Family History: family history includes Asthma in her mother and Cancer in her mother. Social History:  reports that she has been passively smoking.  She has never used smokeless tobacco. She reports that she does not drink alcohol or use illicit drugs.   Prenatal Transfer Tool  Maternal Diabetes: Yes:  Diabetes Type:  Diet controlled Genetic Screening: Normal Maternal Ultrasounds/Referrals: Normal Fetal Ultrasounds or other Referrals:  None Maternal Substance Abuse:  No Significant Maternal Medications:  None Significant Maternal Lab Results:  None Other Comments:  None  ROS  Dilation: 5 Effacement (%): 100 Station: -2 Exam by:: Dr. Rana Snare Blood pressure 132/77, pulse 79, temperature 97.9 F (36.6 C), temperature source Oral, resp. rate 18, height 5\' 3"  (1.6 m), weight 72.576 kg (160 lb), SpO2 99.00%. Exam Physical Exam  Prenatal labs: ABO, Rh: --/--/A NEG (07/17 1345) Antibody: PENDING (07/17 1345) Rubella: Immune (12/19 0000) RPR: NON REACTIVE (07/17 1345)   HBsAg: Negative (12/19 0000)  HIV: Non-reactive (12/19 0000)  GBS: Positive (06/11 0000)   Assessment/Plan: IUP at term in active labor Anticipate SVD Abx for GBS   Garret Teale C 10/23/2012, 5:17 PM

## 2012-10-24 LAB — CBC
HCT: 32.4 % — ABNORMAL LOW (ref 36.0–46.0)
Hemoglobin: 11 g/dL — ABNORMAL LOW (ref 12.0–15.0)
MCHC: 34 g/dL (ref 30.0–36.0)
MCV: 80.2 fL (ref 78.0–100.0)
RDW: 14.2 % (ref 11.5–15.5)
WBC: 9 10*3/uL (ref 4.0–10.5)

## 2012-10-24 MED ORDER — IBUPROFEN 600 MG PO TABS: 600.0000 mg | ORAL_TABLET | Freq: Four times a day (QID) | ORAL | Status: AC | PRN

## 2012-10-24 MED ORDER — BENZOCAINE-MENTHOL 20-0.5 % EX AERO: 1.0000 "application " | INHALATION_SPRAY | CUTANEOUS | Status: DC | PRN

## 2012-10-24 MED ORDER — ACETAMINOPHEN 325 MG PO TABS
650.0000 mg | ORAL_TABLET | Freq: Four times a day (QID) | ORAL | Status: DC | PRN
  Administered 2012-10-24 (×2): 650 mg via ORAL
  Filled 2012-10-24 (×2): qty 2

## 2012-10-24 NOTE — Progress Notes (Signed)
UR chart review completed.  

## 2012-10-24 NOTE — Progress Notes (Signed)
Post Partum Day 1 Subjective: no complaints, up ad lib, voiding and tolerating PO Wants to go home this pm  Objective: Blood pressure 117/76, pulse 71, temperature 98.3 F (36.8 C), temperature source Oral, resp. rate 19, height 5\' 3"  (1.6 m), weight 160 lb (72.576 kg), SpO2 98.00%, unknown if currently breastfeeding.  Physical Exam:  General: alert, cooperative and no distress Lochia: appropriate Uterine Fundus: firm Incision: healing well DVT Evaluation: No evidence of DVT seen on physical exam.   Recent Labs  10/23/12 1345 10/24/12 0540  HGB 11.9* 11.0*  HCT 35.9* 32.4*    Assessment/Plan: Discharge home   LOS: 1 day   Reiley Keisler II,Jessie Cowher E 10/24/2012, 9:37 AM

## 2012-10-24 NOTE — Discharge Summary (Signed)
Obstetric Discharge Summary Reason for Admission: onset of labor Prenatal Procedures: ultrasound and ureteral stent placed and removed Intrapartum Procedures: spontaneous vaginal delivery Postpartum Procedures: none Complications-Operative and Postpartum: none Hemoglobin  Date Value Range Status  10/24/2012 11.0* 12.0 - 15.0 g/dL Final     HCT  Date Value Range Status  10/24/2012 32.4* 36.0 - 46.0 % Final    Physical Exam:  General: alert, cooperative and no distress Lochia: appropriate Uterine Fundus: firm Incision: healing well DVT Evaluation: No evidence of DVT seen on physical exam.  Discharge Diagnoses: Term Pregnancy-delivered  Discharge Information: Date: 10/24/2012 Activity: pelvic rest Diet: routine Medications: PNV, Ibuprofen and tylenol Condition: stable Instructions: refer to practice specific booklet Discharge to: home   Newborn Data: Live born female  Birth Weight: 9 lb 2.9 oz (4165 g) APGAR: 9, 9  Home with mother.  Naoma Boxell II,Price Lachapelle E 10/24/2012, 9:40 AM

## 2012-10-24 NOTE — Anesthesia Postprocedure Evaluation (Signed)
  Anesthesia Post-op Note  Patient: Judy Hart  Procedure(s) Performed: * No procedures listed *  Patient Location: PACU and Mother/Baby  Anesthesia Type:Epidural  Level of Consciousness: awake, alert  and oriented  Airway and Oxygen Therapy: Patient Spontanous Breathing  Post-op Pain: none  Post-op Assessment: Post-op Vital signs reviewed, Patient's Cardiovascular Status Stable, No headache, No backache, No residual numbness and No residual motor weakness  Post-op Vital Signs: Reviewed and stable  Complications: No apparent anesthesia complications

## 2012-10-24 NOTE — Lactation Note (Signed)
This note was copied from the chart of Girl Criss Bartles. Lactation Consultation Note  Patient Name: Girl Tobie Perdue [id number] Date: 10/24/2012 Reason for consult: Initial assessment   Maternal Data Formula Feeding for Exclusion: No Infant to breast within first hour of birth: Yes Does the patient have breastfeeding experience prior to this delivery?: Yes  Feeding Feeding Type: Breast Milk Feeding method: Breast Length of feed: 10 min  LATCH Score/Interventions Latch: Grasps breast easily, tongue down, lips flanged, rhythmical sucking.  Audible Swallowing: A few with stimulation  Type of Nipple: Everted at rest and after stimulation  Comfort (Breast/Nipple): Soft / non-tender     Hold (Positioning): No assistance needed to correctly position infant at breast.  LATCH Score: 9  Lactation Tools Discussed/Used     Consult Status Consult Status: Complete  Experienced BF mom reports that her nipples are sore but otherwise baby is nursing well. Observed latch- mom using side lying position and mom did well with positioning baby. Reports some pain with latch but eases off after a few minutes. Asking about lanolin- encouraged to rub EBM into nipples. Comfort gels given with instructions for use and cleaning. BF brochure given with resources for support after DC. BFSG and OP appointments. No questions at present. To call prn  Pamelia Hoit 10/24/2012, 10:25 AM

## 2012-10-25 LAB — TYPE AND SCREEN
ABO/RH(D): A NEG
Antibody Screen: POSITIVE
DAT, IgG: NEGATIVE
Donor AG Type: NEGATIVE
Donor AG Type: NEGATIVE
Donor AG Type: NEGATIVE
Unit division: 0
Unit division: 0

## 2014-02-08 ENCOUNTER — Encounter (HOSPITAL_COMMUNITY): Payer: Self-pay | Admitting: *Deleted

## 2021-01-03 DIAGNOSIS — Z6822 Body mass index (BMI) 22.0-22.9, adult: Secondary | ICD-10-CM | POA: Diagnosis not present

## 2021-01-03 DIAGNOSIS — Z01419 Encounter for gynecological examination (general) (routine) without abnormal findings: Secondary | ICD-10-CM | POA: Diagnosis not present

## 2021-01-03 DIAGNOSIS — Z1239 Encounter for other screening for malignant neoplasm of breast: Secondary | ICD-10-CM | POA: Diagnosis not present

## 2021-01-03 DIAGNOSIS — Z1231 Encounter for screening mammogram for malignant neoplasm of breast: Secondary | ICD-10-CM | POA: Diagnosis not present

## 2021-01-17 DIAGNOSIS — R0602 Shortness of breath: Secondary | ICD-10-CM | POA: Diagnosis not present

## 2021-01-17 DIAGNOSIS — R059 Cough, unspecified: Secondary | ICD-10-CM | POA: Diagnosis not present

## 2021-01-17 DIAGNOSIS — J069 Acute upper respiratory infection, unspecified: Secondary | ICD-10-CM | POA: Diagnosis not present

## 2021-01-17 DIAGNOSIS — R519 Headache, unspecified: Secondary | ICD-10-CM | POA: Diagnosis not present

## 2021-01-17 DIAGNOSIS — B9789 Other viral agents as the cause of diseases classified elsewhere: Secondary | ICD-10-CM | POA: Diagnosis not present

## 2021-01-17 DIAGNOSIS — R5383 Other fatigue: Secondary | ICD-10-CM | POA: Diagnosis not present

## 2021-01-17 DIAGNOSIS — J302 Other seasonal allergic rhinitis: Secondary | ICD-10-CM | POA: Diagnosis not present

## 2021-01-17 DIAGNOSIS — Z20822 Contact with and (suspected) exposure to covid-19: Secondary | ICD-10-CM | POA: Diagnosis not present

## 2021-01-17 DIAGNOSIS — J45901 Unspecified asthma with (acute) exacerbation: Secondary | ICD-10-CM | POA: Diagnosis not present

## 2021-03-20 DIAGNOSIS — N760 Acute vaginitis: Secondary | ICD-10-CM | POA: Diagnosis not present

## 2022-03-23 ENCOUNTER — Other Ambulatory Visit (HOSPITAL_BASED_OUTPATIENT_CLINIC_OR_DEPARTMENT_OTHER): Payer: Self-pay

## 2022-03-23 DIAGNOSIS — Z1231 Encounter for screening mammogram for malignant neoplasm of breast: Secondary | ICD-10-CM | POA: Diagnosis not present

## 2022-03-23 DIAGNOSIS — Z01419 Encounter for gynecological examination (general) (routine) without abnormal findings: Secondary | ICD-10-CM | POA: Diagnosis not present

## 2022-03-23 DIAGNOSIS — Z803 Family history of malignant neoplasm of breast: Secondary | ICD-10-CM | POA: Diagnosis not present

## 2022-03-23 DIAGNOSIS — Z124 Encounter for screening for malignant neoplasm of cervix: Secondary | ICD-10-CM | POA: Diagnosis not present

## 2022-03-23 DIAGNOSIS — Z6823 Body mass index (BMI) 23.0-23.9, adult: Secondary | ICD-10-CM | POA: Diagnosis not present

## 2022-03-23 MED ORDER — HAILEY 24 FE 1-20 MG-MCG(24) PO TABS
1.0000 | ORAL_TABLET | Freq: Every day | ORAL | 4 refills | Status: AC
  Filled 2022-03-23 – 2022-07-14 (×2): qty 84, 84d supply, fill #0
  Filled 2022-10-06: qty 84, 84d supply, fill #1
  Filled 2022-12-29: qty 84, 84d supply, fill #2
  Filled 2023-02-19: qty 28, 28d supply, fill #2
  Filled 2023-03-19: qty 28, 28d supply, fill #3

## 2022-03-28 ENCOUNTER — Other Ambulatory Visit: Payer: Self-pay | Admitting: Obstetrics and Gynecology

## 2022-03-28 ENCOUNTER — Other Ambulatory Visit: Payer: Self-pay

## 2022-03-28 DIAGNOSIS — R928 Other abnormal and inconclusive findings on diagnostic imaging of breast: Secondary | ICD-10-CM

## 2022-04-12 ENCOUNTER — Ambulatory Visit
Admission: RE | Admit: 2022-04-12 | Discharge: 2022-04-12 | Disposition: A | Discharge status: Home / Self Care | Payer: BC Managed Care – PPO | Source: Ambulatory Visit | Attending: Obstetrics and Gynecology | Admitting: Obstetrics and Gynecology

## 2022-04-12 ENCOUNTER — Other Ambulatory Visit: Payer: Self-pay | Admitting: Obstetrics and Gynecology

## 2022-04-12 DIAGNOSIS — R928 Other abnormal and inconclusive findings on diagnostic imaging of breast: Secondary | ICD-10-CM

## 2022-04-12 DIAGNOSIS — R921 Mammographic calcification found on diagnostic imaging of breast: Secondary | ICD-10-CM

## 2022-04-12 DIAGNOSIS — R922 Inconclusive mammogram: Secondary | ICD-10-CM | POA: Diagnosis not present

## 2022-05-01 DIAGNOSIS — Z809 Family history of malignant neoplasm, unspecified: Secondary | ICD-10-CM | POA: Diagnosis not present

## 2022-05-07 ENCOUNTER — Other Ambulatory Visit (HOSPITAL_BASED_OUTPATIENT_CLINIC_OR_DEPARTMENT_OTHER): Payer: Self-pay

## 2022-05-07 MED ORDER — ALPRAZOLAM 0.25 MG PO TABS
0.2500 mg | ORAL_TABLET | Freq: Three times a day (TID) | ORAL | 0 refills | Status: DC | PRN
  Filled 2022-05-07: qty 30, 10d supply, fill #0

## 2022-05-07 MED ORDER — HAILEY 24 FE 1-20 MG-MCG(24) PO TABS
1.0000 | ORAL_TABLET | Freq: Every day | ORAL | 4 refills | Status: DC
  Filled 2022-05-07: qty 84, 84d supply, fill #0

## 2022-05-08 ENCOUNTER — Other Ambulatory Visit (HOSPITAL_BASED_OUTPATIENT_CLINIC_OR_DEPARTMENT_OTHER): Payer: Self-pay

## 2022-05-11 ENCOUNTER — Ambulatory Visit: Payer: BC Managed Care – PPO | Admitting: Family

## 2022-05-11 ENCOUNTER — Encounter: Payer: Self-pay | Admitting: Family

## 2022-05-11 VITALS — BP 114/68 | HR 73 | Resp 18 | Ht 63.0 in | Wt 132.0 lb

## 2022-05-11 DIAGNOSIS — Z13220 Encounter for screening for lipoid disorders: Secondary | ICD-10-CM | POA: Diagnosis not present

## 2022-05-11 DIAGNOSIS — G8929 Other chronic pain: Secondary | ICD-10-CM

## 2022-05-11 DIAGNOSIS — R519 Headache, unspecified: Secondary | ICD-10-CM

## 2022-05-11 MED ORDER — KETOROLAC TROMETHAMINE 30 MG/ML IJ SOLN
30.0000 mg | Freq: Once | INTRAMUSCULAR | Status: AC
  Administered 2022-05-11: 30 mg via INTRAMUSCULAR

## 2022-05-11 MED ORDER — KETOROLAC TROMETHAMINE 30 MG/ML IJ SOLN: 30.0000 mg | Freq: Once | INTRAMUSCULAR | Status: DC

## 2022-05-11 NOTE — Progress Notes (Signed)
Judy Hart is a 37 y.o. female with the following history as recorded in EpicCare:  There are no problems to display for this patient.   Current Outpatient Medications  Medication Sig Dispense Refill   ALPRAZolam (XANAX) 0.25 MG tablet Take 1 tablet (0.25 mg total) by mouth every 8 (eight) hours as needed for anxiety. 30 tablet 0   BORIC ACID VAGINAL VA Place vaginally.     cetirizine (ZYRTEC) 10 MG tablet Take 10 mg by mouth daily.     fluticasone (FLONASE) 50 MCG/ACT nasal spray Place 2 sprays into the nose daily.     ibuprofen (ADVIL,MOTRIN) 600 MG tablet Take 1 tablet (600 mg total) by mouth every 6 (six) hours as needed for pain. 30 tablet 0   Norethindrone Acetate-Ethinyl Estrad-FE (HAILEY 24 FE) 1-20 MG-MCG(24) tablet Take 1 tablet by mouth daily. 84 tablet 4   No current facility-administered medications for this visit.    Allergies: Claritin [loratadine] and Erythromycin  Past Medical History:  Diagnosis Date   Asthma    childhood asthma   Chronic kidney disease    kidney stones   Diabetes mellitus without complication ([place])    gestational diabetes with 2 pregnancies   GERD (gastroesophageal reflux disease)    Headache(784.0)    rare migraines   Heart murmur    found with first pregnancy   Transfusion history    2 transfusions postpartum last pregnancy    Past Surgical History:  Procedure Laterality Date   CYSTOSCOPY W/ URETERAL STENT PLACEMENT Right 09/19/2012   Procedure: CYSTOSCOPY  /RIGHT URETERAL STENT PLACEMENT;  Surgeon: Hanley Ben, MD;  Location: [facility];  Service: Urology;  Laterality: Right;   WISDOM TOOTH EXTRACTION  2003    Family History  Problem Relation Age of Onset   Cancer Mother    Asthma Mother     Social History   Tobacco Use   Smoking status: Never    Passive exposure: Yes   Smokeless tobacco: Never  Substance Use Topics   Alcohol use: No    Comment: not while pregnant    Subjective:   Presents today as a new patient;  majority of healthcare needs are managed by GYN; being followed with mammograms every 6 months due to [place] of breast cancer; Admits has struggled with headaches her entire life- had been most problematic with her period but now transitioning into some type of daily headache; would be open to meeting with specialist to discuss treatment; is taking increased amounts of Ibuprofen to help manage headaches; does need new mouth guard;   Would prefer to return at later date for labs;     Objective:  Vitals:   05/11/22 1511  BP: 114/68  Pulse: 73  Resp: 18  SpO2: 99%  Weight: 132 lb (59.9 kg)  Height: 5\' 3"  (1.6 m)    General: Well developed, well nourished, in no acute distress  Skin : Warm and dry.  Head: Normocephalic and atraumatic  Eyes: Sclera and conjunctiva clear; pupils round and reactive to light; extraocular movements intact  Ears: External normal; canals clear; tympanic membranes normal  Oropharynx: Pink, supple. No suspicious lesions  Neck: Supple without thyromegaly, adenopathy  Lungs: Respirations unlabored; clear to auscultation bilaterally without wheeze, rales, rhonchi  CVS exam: normal rate and regular rhythm.  Neurologic: Alert and oriented; speech intact; face symmetrical; moves all extremities well; CNII-XII intact without focal deficit   Assessment:  1. Chronic nonintractable headache, unspecified headache type     Plan:  Discussed presentation of migraines; do think she could benefit from preventive treatment options- will refer to neurology to discuss further; Toradol IM 30 mg given in office today to try and offer some long term relief for patient; she will return for fasting labs at later date;   Return for fasting labs at patient's convenience.  Orders Placed This Encounter  Procedures   Ambulatory referral to Neurology    Referral Priority:   Routine    Referral Type:   Consultation    Referral Reason:   Specialty Services Required    Requested Specialty:    Neurology    Number of Visits Requested:   1    Requested Prescriptions    No prescriptions requested or ordered in this encounter

## 2022-07-05 ENCOUNTER — Other Ambulatory Visit (HOSPITAL_BASED_OUTPATIENT_CLINIC_OR_DEPARTMENT_OTHER): Payer: Self-pay

## 2022-07-05 DIAGNOSIS — G43709 Chronic migraine without aura, not intractable, without status migrainosus: Secondary | ICD-10-CM | POA: Diagnosis not present

## 2022-07-05 MED ORDER — RIZATRIPTAN BENZOATE 5 MG PO TABS
5.0000 mg | ORAL_TABLET | Freq: Every day | ORAL | 2 refills | Status: DC | PRN
  Filled 2022-07-05: qty 18, 30d supply, fill #0
  Filled 2022-08-25: qty 12, 20d supply, fill #1

## 2022-07-05 MED ORDER — TOPIRAMATE 25 MG PO TABS
ORAL_TABLET | ORAL | 0 refills | Status: DC
  Filled 2022-07-05: qty 60, 30d supply, fill #0

## 2022-07-09 ENCOUNTER — Other Ambulatory Visit (HOSPITAL_BASED_OUTPATIENT_CLINIC_OR_DEPARTMENT_OTHER): Payer: Self-pay

## 2022-07-09 MED ORDER — METRONIDAZOLE 0.75 % VA GEL
VAGINAL | 0 refills | Status: DC
  Filled 2022-07-09: qty 70, 5d supply, fill #0

## 2022-07-14 ENCOUNTER — Other Ambulatory Visit (HOSPITAL_COMMUNITY): Payer: Self-pay

## 2022-08-13 ENCOUNTER — Other Ambulatory Visit (HOSPITAL_BASED_OUTPATIENT_CLINIC_OR_DEPARTMENT_OTHER): Payer: Self-pay

## 2022-08-13 DIAGNOSIS — N760 Acute vaginitis: Secondary | ICD-10-CM | POA: Diagnosis not present

## 2022-08-13 DIAGNOSIS — N941 Unspecified dyspareunia: Secondary | ICD-10-CM | POA: Diagnosis not present

## 2022-08-13 MED ORDER — TINIDAZOLE 500 MG PO TABS
1000.0000 mg | ORAL_TABLET | Freq: Every day | ORAL | 0 refills | Status: DC
  Filled 2022-08-13: qty 10, 5d supply, fill #0

## 2022-08-14 ENCOUNTER — Other Ambulatory Visit (HOSPITAL_BASED_OUTPATIENT_CLINIC_OR_DEPARTMENT_OTHER): Payer: Self-pay

## 2022-08-15 ENCOUNTER — Other Ambulatory Visit (HOSPITAL_BASED_OUTPATIENT_CLINIC_OR_DEPARTMENT_OTHER): Payer: Self-pay

## 2022-08-15 MED ORDER — TOPIRAMATE 25 MG PO TABS
ORAL_TABLET | ORAL | 0 refills | Status: DC
  Filled 2022-08-15: qty 60, 37d supply, fill #0

## 2022-08-16 ENCOUNTER — Other Ambulatory Visit (HOSPITAL_BASED_OUTPATIENT_CLINIC_OR_DEPARTMENT_OTHER): Payer: Self-pay

## 2022-08-23 ENCOUNTER — Telehealth: Payer: Self-pay | Admitting: Family

## 2022-08-23 NOTE — Telephone Encounter (Signed)
Pt had lab orders for 05/11/22 but has her physical scheduled for 09/13/22. Patient wants to know if she should come in this month to have labs done or if she should just wait until her appointment to get them done same day. Please advise.

## 2022-08-24 NOTE — Telephone Encounter (Signed)
It's fine to just do labs when she comes.

## 2022-08-24 NOTE — Telephone Encounter (Signed)
Spoke with pt, pt is aware and expressed understanding.  

## 2022-08-27 ENCOUNTER — Other Ambulatory Visit (HOSPITAL_BASED_OUTPATIENT_CLINIC_OR_DEPARTMENT_OTHER): Payer: Self-pay

## 2022-09-04 DIAGNOSIS — G43709 Chronic migraine without aura, not intractable, without status migrainosus: Secondary | ICD-10-CM | POA: Diagnosis not present

## 2022-09-13 ENCOUNTER — Ambulatory Visit (INDEPENDENT_AMBULATORY_CARE_PROVIDER_SITE_OTHER): Payer: BC Managed Care – PPO | Admitting: Family

## 2022-09-13 ENCOUNTER — Encounter: Payer: Self-pay | Admitting: Family

## 2022-09-13 VITALS — BP 112/62 | HR 70 | Ht 63.0 in | Wt 129.2 lb

## 2022-09-13 DIAGNOSIS — Z13220 Encounter for screening for lipoid disorders: Secondary | ICD-10-CM | POA: Diagnosis not present

## 2022-09-13 DIAGNOSIS — Z0000 Encounter for general adult medical examination without abnormal findings: Secondary | ICD-10-CM | POA: Diagnosis not present

## 2022-09-13 LAB — CBC WITH DIFFERENTIAL/PLATELET
Basophils Absolute: 0 10*3/uL (ref 0.0–0.1)
Basophils Relative: 0.8 % (ref 0.0–3.0)
Eosinophils Absolute: 0.2 10*3/uL (ref 0.0–0.7)
Eosinophils Relative: 5.6 % — ABNORMAL HIGH (ref 0.0–5.0)
HCT: 44.7 % (ref 36.0–46.0)
Hemoglobin: 14.7 g/dL (ref 12.0–15.0)
Lymphocytes Relative: 30.7 % (ref 12.0–46.0)
Lymphs Abs: 1.2 10*3/uL (ref 0.7–4.0)
MCHC: 32.9 g/dL (ref 30.0–36.0)
MCV: 88.7 fl (ref 78.0–100.0)
Monocytes Absolute: 0.3 10*3/uL (ref 0.1–1.0)
Monocytes Relative: 6.9 % (ref 3.0–12.0)
Neutro Abs: 2.2 10*3/uL (ref 1.4–7.7)
Neutrophils Relative %: 56 % (ref 43.0–77.0)
Platelets: 236 10*3/uL (ref 150.0–400.0)
RBC: 5.04 Mil/uL (ref 3.87–5.11)
RDW: 13.5 % (ref 11.5–15.5)
WBC: 3.9 10*3/uL — ABNORMAL LOW (ref 4.0–10.5)

## 2022-09-13 LAB — COMPREHENSIVE METABOLIC PANEL WITH GFR
ALT: 25 U/L (ref 0–35)
AST: 18 U/L (ref 0–37)
Albumin: 4.4 g/dL (ref 3.5–5.2)
Alkaline Phosphatase: 33 U/L — ABNORMAL LOW (ref 39–117)
BUN: 13 mg/dL (ref 6–23)
CO2: 28 meq/L (ref 19–32)
Calcium: 9.6 mg/dL (ref 8.4–10.5)
Chloride: 105 meq/L (ref 96–112)
Creatinine, Ser: 0.65 mg/dL (ref 0.40–1.20)
GFR: 112.27 mL/min
Glucose, Bld: 100 mg/dL — ABNORMAL HIGH (ref 70–99)
Potassium: 4.4 meq/L (ref 3.5–5.1)
Sodium: 142 meq/L (ref 135–145)
Total Bilirubin: 0.5 mg/dL (ref 0.2–1.2)
Total Protein: 7.1 g/dL (ref 6.0–8.3)

## 2022-09-13 LAB — LIPID PANEL
Cholesterol: 151 mg/dL (ref 0–200)
HDL: 61.8 mg/dL (ref >39.00)
LDL Cholesterol: 79 mg/dL (ref 0–99)
NonHDL: 89.68
Total CHOL/HDL Ratio: 2
Triglycerides: 55 mg/dL (ref 0.0–149.0)
VLDL: 11 mg/dL (ref 0.0–40.0)

## 2022-09-13 LAB — TSH: TSH: 3.16 u[IU]/mL (ref 0.35–5.50)

## 2022-09-13 NOTE — Progress Notes (Signed)
Judy Hart is a 37 y.o. female with the following history as recorded in EpicCare:  There are no problems to display for this patient.   Current Outpatient Medications  Medication Sig Dispense Refill   ALPRAZolam (XANAX) 0.25 MG tablet Take 1 tablet (0.25 mg total) by mouth every 8 (eight) hours as needed for anxiety. 30 tablet 0   Atogepant (QULIPTA) 10 MG TABS Take by mouth.     BORIC ACID VAGINAL VA Place vaginally.     cetirizine (ZYRTEC) 10 MG tablet Take 10 mg by mouth daily.     fluticasone (FLONASE) 50 MCG/ACT nasal spray Place 2 sprays into the nose daily.     ibuprofen (ADVIL,MOTRIN) 600 MG tablet Take 1 tablet (600 mg total) by mouth every 6 (six) hours as needed for pain. 30 tablet 0   metroNIDAZOLE (METROGEL) 0.75 % vaginal gel Insert 1 applicatorful every day by vaginal route at bedtime for 5 days. 70 g 0   Norethindrone Acetate-Ethinyl Estrad-FE (HAILEY 24 FE) 1-20 MG-MCG(24) tablet Take 1 tablet by mouth daily as directed 84 tablet 4   No current facility-administered medications for this visit.    Allergies: Claritin [loratadine] and Erythromycin  Past Medical History:  Diagnosis Date   Asthma    childhood asthma   Chronic kidney disease    kidney stones   Diabetes mellitus without complication ([facility])    gestational diabetes with 2 pregnancies   GERD (gastroesophageal reflux disease)    Headache(784.0)    rare migraines   Heart murmur    found with first pregnancy   Transfusion history    2 transfusions postpartum last pregnancy    Past Surgical History:  Procedure Laterality Date   CYSTOSCOPY W/ URETERAL STENT PLACEMENT Right 09/19/2012   Procedure: CYSTOSCOPY  /RIGHT URETERAL STENT PLACEMENT;  Surgeon: Lindaann Slough, MD;  Location: [facility];  Service: Urology;  Laterality: Right;   WISDOM TOOTH EXTRACTION  2003    Family History  Problem Relation Age of Onset   Cancer Mother    Asthma Mother     Social History   Tobacco Use   Smoking status:  Never    Passive exposure: Yes   Smokeless tobacco: Never  Substance Use Topics   Alcohol use: No    Comment: not while pregnant    Subjective:   Patient presents for yearly CPE; does see GYN regularly; 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   HENT: Negative.    Eyes: Negative.   Respiratory: Negative.    Cardiovascular: Negative.   Gastrointestinal: Negative.   Genitourinary: Negative.   Musculoskeletal: Negative.   Skin: Negative.   Neurological:  Positive for headaches.       Known migraine headache  Endo/Heme/Allergies: Negative.   Psychiatric/Behavioral: Negative.         Objective:  Vitals:   09/13/22 0835  BP: 112/62  Pulse: 70  SpO2: 99%  Weight: 129 lb 3.2 oz (58.6 kg)  Height: 5\' 3"  (1.6 m)    General: Well developed, well nourished, in no acute distress  Skin : Warm and dry.  Head: Normocephalic and atraumatic  Eyes: Sclera and conjunctiva clear; pupils round and reactive to light; extraocular movements intact  Ears: External normal; canals clear; tympanic membranes normal  Oropharynx: Pink, supple. No suspicious lesions  Neck: Supple without thyromegaly, adenopathy  Lungs: Respirations unlabored; clear to auscultation bilaterally without wheeze, rales, rhonchi  CVS exam: normal rate  and regular rhythm.  Abdomen: Soft; nontender; nondistended; normoactive bowel sounds; no masses or hepatosplenomegaly  Musculoskeletal: No deformities; no active joint inflammation  Extremities: No edema, cyanosis, clubbing  Vessels: Symmetric bilaterally  Neurologic: Alert and oriented; speech intact; face symmetrical; moves all extremities well; CNII-XII intact without focal deficit   Assessment:  1. PE (physical exam), annual   2. Lipid screening     Plan:  Age appropriate preventive healthcare needs addressed; encouraged regular eye doctor and dental exams; encouraged regular exercise; will update labs and  refills as needed today; follow-up to be determined;   No follow-ups on file.  Orders Placed This Encounter  Procedures   CBC with Differential/Platelet   Comp Met (CMET)   Lipid panel   TSH    Requested Prescriptions    No prescriptions requested or ordered in this encounter

## 2022-11-19 ENCOUNTER — Other Ambulatory Visit (HOSPITAL_BASED_OUTPATIENT_CLINIC_OR_DEPARTMENT_OTHER): Payer: Self-pay

## 2022-11-20 ENCOUNTER — Other Ambulatory Visit (HOSPITAL_BASED_OUTPATIENT_CLINIC_OR_DEPARTMENT_OTHER): Payer: Self-pay

## 2022-11-20 ENCOUNTER — Other Ambulatory Visit: Payer: Self-pay

## 2022-11-20 MED ORDER — ALPRAZOLAM 0.25 MG PO TABS
0.2500 mg | ORAL_TABLET | Freq: Three times a day (TID) | ORAL | 0 refills | Status: DC | PRN
  Filled 2022-11-20: qty 30, 10d supply, fill #0

## 2022-12-13 ENCOUNTER — Other Ambulatory Visit: Payer: Self-pay | Admitting: Obstetrics and Gynecology

## 2022-12-13 ENCOUNTER — Ambulatory Visit
Admission: RE | Admit: 2022-12-13 | Discharge: 2022-12-13 | Disposition: A | Discharge status: Home / Self Care | Payer: BC Managed Care – PPO | Source: Ambulatory Visit | Attending: Obstetrics and Gynecology | Admitting: Obstetrics and Gynecology

## 2022-12-13 DIAGNOSIS — R921 Mammographic calcification found on diagnostic imaging of breast: Secondary | ICD-10-CM

## 2023-01-05 ENCOUNTER — Other Ambulatory Visit (HOSPITAL_COMMUNITY): Payer: Self-pay

## 2023-01-07 ENCOUNTER — Other Ambulatory Visit (HOSPITAL_BASED_OUTPATIENT_CLINIC_OR_DEPARTMENT_OTHER): Payer: Self-pay

## 2023-01-07 ENCOUNTER — Other Ambulatory Visit (HOSPITAL_COMMUNITY): Payer: Self-pay

## 2023-01-07 MED ORDER — TARINA 24 FE 1-20 MG-MCG(24) PO TABS
1.0000 | ORAL_TABLET | Freq: Every day | ORAL | 0 refills | Status: AC
Start: 2023-01-06 — End: ?
  Filled 2023-01-07 – 2023-03-25 (×2): qty 28, 28d supply, fill #0
  Filled 2023-03-26: qty 84, 84d supply, fill #0
  Filled 2023-03-27 – 2023-04-09 (×2): qty 28, 28d supply, fill #0
  Filled 2023-04-10: qty 84, 84d supply, fill #0

## 2023-01-07 MED ORDER — RIZATRIPTAN BENZOATE 10 MG PO TABS
10.0000 mg | ORAL_TABLET | ORAL | 5 refills | Status: DC
  Filled 2023-01-07: qty 10, 20d supply, fill #0
  Filled 2023-04-12: qty 10, 20d supply, fill #1
  Filled 2023-06-05 – 2023-06-30 (×2): qty 10, 20d supply, fill #2
  Filled 2023-07-23 – 2023-07-25 (×2): qty 10, 20d supply, fill #3

## 2023-01-21 ENCOUNTER — Other Ambulatory Visit (HOSPITAL_BASED_OUTPATIENT_CLINIC_OR_DEPARTMENT_OTHER): Payer: Self-pay

## 2023-01-21 MED ORDER — HAILEY 24 FE 1-20 MG-MCG(24) PO TABS
1.0000 | ORAL_TABLET | Freq: Every day | ORAL | 4 refills | Status: AC
Start: 2023-01-21 — End: ?
  Filled 2023-01-21: qty 28, 28d supply, fill #0
  Filled 2023-03-26: qty 28, 28d supply, fill #1
  Filled 2023-07-23: qty 84, 84d supply, fill #1

## 2023-01-22 ENCOUNTER — Other Ambulatory Visit (HOSPITAL_BASED_OUTPATIENT_CLINIC_OR_DEPARTMENT_OTHER): Payer: Self-pay

## 2023-01-23 ENCOUNTER — Other Ambulatory Visit (HOSPITAL_BASED_OUTPATIENT_CLINIC_OR_DEPARTMENT_OTHER): Payer: Self-pay

## 2023-01-24 ENCOUNTER — Other Ambulatory Visit (HOSPITAL_COMMUNITY): Payer: Self-pay

## 2023-01-24 ENCOUNTER — Other Ambulatory Visit (HOSPITAL_BASED_OUTPATIENT_CLINIC_OR_DEPARTMENT_OTHER): Payer: Self-pay

## 2023-01-25 ENCOUNTER — Other Ambulatory Visit (HOSPITAL_BASED_OUTPATIENT_CLINIC_OR_DEPARTMENT_OTHER): Payer: Self-pay

## 2023-01-29 ENCOUNTER — Other Ambulatory Visit (HOSPITAL_BASED_OUTPATIENT_CLINIC_OR_DEPARTMENT_OTHER): Payer: Self-pay

## 2023-02-19 ENCOUNTER — Other Ambulatory Visit (HOSPITAL_BASED_OUTPATIENT_CLINIC_OR_DEPARTMENT_OTHER): Payer: Self-pay

## 2023-03-19 ENCOUNTER — Other Ambulatory Visit: Payer: Self-pay

## 2023-03-25 ENCOUNTER — Other Ambulatory Visit (HOSPITAL_BASED_OUTPATIENT_CLINIC_OR_DEPARTMENT_OTHER): Payer: Self-pay

## 2023-03-26 ENCOUNTER — Other Ambulatory Visit: Payer: Self-pay

## 2023-03-27 ENCOUNTER — Other Ambulatory Visit (HOSPITAL_BASED_OUTPATIENT_CLINIC_OR_DEPARTMENT_OTHER): Payer: Self-pay

## 2023-03-29 ENCOUNTER — Other Ambulatory Visit (HOSPITAL_BASED_OUTPATIENT_CLINIC_OR_DEPARTMENT_OTHER): Payer: Self-pay

## 2023-04-01 ENCOUNTER — Other Ambulatory Visit: Payer: Self-pay

## 2023-04-01 ENCOUNTER — Other Ambulatory Visit (HOSPITAL_BASED_OUTPATIENT_CLINIC_OR_DEPARTMENT_OTHER): Payer: Self-pay

## 2023-04-01 MED ORDER — LEVONORGESTREL-ETHINYL ESTRAD 0.1-20 MG-MCG PO TABS
1.0000 | ORAL_TABLET | Freq: Every day | ORAL | 3 refills | Status: DC
  Filled 2023-04-01 – 2023-04-09 (×2): qty 28, 28d supply, fill #0
  Filled 2023-04-09 (×2): qty 84, 84d supply, fill #0
  Filled 2023-06-25: qty 28, 28d supply, fill #1
  Filled 2023-07-23 (×2): qty 28, 28d supply, fill #2
  Filled 2023-07-23: qty 84, 84d supply, fill #2
  Filled 2023-08-20: qty 28, 28d supply, fill #3
  Filled 2023-09-17 – 2023-10-01 (×2): qty 28, 28d supply, fill #4
  Filled 2023-10-22: qty 28, 28d supply, fill #5
  Filled 2023-11-19 – 2024-03-23 (×7): qty 28, 28d supply, fill #6

## 2023-04-04 ENCOUNTER — Other Ambulatory Visit: Payer: Self-pay | Admitting: Family

## 2023-04-04 DIAGNOSIS — Z9189 Other specified personal risk factors, not elsewhere classified: Secondary | ICD-10-CM

## 2023-04-09 ENCOUNTER — Encounter (HOSPITAL_BASED_OUTPATIENT_CLINIC_OR_DEPARTMENT_OTHER): Payer: Self-pay | Admitting: Pharmacist

## 2023-04-09 ENCOUNTER — Other Ambulatory Visit (HOSPITAL_BASED_OUTPATIENT_CLINIC_OR_DEPARTMENT_OTHER): Payer: Self-pay

## 2023-04-09 ENCOUNTER — Other Ambulatory Visit: Payer: Self-pay

## 2023-04-10 ENCOUNTER — Other Ambulatory Visit (HOSPITAL_BASED_OUTPATIENT_CLINIC_OR_DEPARTMENT_OTHER): Payer: Self-pay

## 2023-04-19 ENCOUNTER — Ambulatory Visit
Admission: RE | Admit: 2023-04-19 | Discharge: 2023-04-19 | Disposition: A | Discharge status: Home / Self Care | Payer: 59 | Source: Ambulatory Visit | Attending: Obstetrics and Gynecology | Admitting: Obstetrics and Gynecology

## 2023-04-19 DIAGNOSIS — R921 Mammographic calcification found on diagnostic imaging of breast: Secondary | ICD-10-CM

## 2023-06-05 ENCOUNTER — Other Ambulatory Visit (HOSPITAL_BASED_OUTPATIENT_CLINIC_OR_DEPARTMENT_OTHER): Payer: Self-pay

## 2023-06-25 ENCOUNTER — Other Ambulatory Visit (HOSPITAL_BASED_OUTPATIENT_CLINIC_OR_DEPARTMENT_OTHER): Payer: Self-pay

## 2023-07-23 ENCOUNTER — Other Ambulatory Visit: Payer: Self-pay

## 2023-07-23 ENCOUNTER — Other Ambulatory Visit (HOSPITAL_BASED_OUTPATIENT_CLINIC_OR_DEPARTMENT_OTHER): Payer: Self-pay

## 2023-08-05 ENCOUNTER — Other Ambulatory Visit (HOSPITAL_BASED_OUTPATIENT_CLINIC_OR_DEPARTMENT_OTHER): Payer: Self-pay

## 2023-09-30 ENCOUNTER — Other Ambulatory Visit (HOSPITAL_BASED_OUTPATIENT_CLINIC_OR_DEPARTMENT_OTHER): Payer: Self-pay

## 2023-10-24 ENCOUNTER — Other Ambulatory Visit (HOSPITAL_BASED_OUTPATIENT_CLINIC_OR_DEPARTMENT_OTHER): Payer: Self-pay

## 2023-10-25 ENCOUNTER — Other Ambulatory Visit (HOSPITAL_BASED_OUTPATIENT_CLINIC_OR_DEPARTMENT_OTHER): Payer: Self-pay

## 2023-10-25 MED ORDER — ALPRAZOLAM 0.25 MG PO TABS
0.2500 mg | ORAL_TABLET | Freq: Three times a day (TID) | ORAL | 0 refills | Status: DC | PRN
  Filled 2023-10-25 – 2023-11-11 (×2): qty 30, 10d supply, fill #0

## 2023-11-04 ENCOUNTER — Other Ambulatory Visit (HOSPITAL_BASED_OUTPATIENT_CLINIC_OR_DEPARTMENT_OTHER): Payer: Self-pay

## 2023-11-11 ENCOUNTER — Other Ambulatory Visit (HOSPITAL_BASED_OUTPATIENT_CLINIC_OR_DEPARTMENT_OTHER): Payer: Self-pay

## 2023-11-19 ENCOUNTER — Other Ambulatory Visit (HOSPITAL_BASED_OUTPATIENT_CLINIC_OR_DEPARTMENT_OTHER): Payer: Self-pay

## 2024-01-31 ENCOUNTER — Other Ambulatory Visit (HOSPITAL_BASED_OUTPATIENT_CLINIC_OR_DEPARTMENT_OTHER): Payer: Self-pay

## 2024-02-13 ENCOUNTER — Other Ambulatory Visit (HOSPITAL_BASED_OUTPATIENT_CLINIC_OR_DEPARTMENT_OTHER): Payer: Self-pay

## 2024-02-24 ENCOUNTER — Other Ambulatory Visit (HOSPITAL_BASED_OUTPATIENT_CLINIC_OR_DEPARTMENT_OTHER): Payer: Self-pay

## 2024-03-09 ENCOUNTER — Other Ambulatory Visit (HOSPITAL_BASED_OUTPATIENT_CLINIC_OR_DEPARTMENT_OTHER): Payer: Self-pay

## 2024-03-20 ENCOUNTER — Other Ambulatory Visit (HOSPITAL_BASED_OUTPATIENT_CLINIC_OR_DEPARTMENT_OTHER): Payer: Self-pay

## 2024-04-03 ENCOUNTER — Other Ambulatory Visit (HOSPITAL_BASED_OUTPATIENT_CLINIC_OR_DEPARTMENT_OTHER): Payer: Self-pay

## 2024-04-06 ENCOUNTER — Other Ambulatory Visit (HOSPITAL_BASED_OUTPATIENT_CLINIC_OR_DEPARTMENT_OTHER): Payer: Self-pay

## 2024-04-06 MED ORDER — LEVONORGESTREL-ETHINYL ESTRAD 0.1-20 MG-MCG PO TABS
1.0000 | ORAL_TABLET | Freq: Every day | ORAL | 0 refills | Status: AC
  Filled 2024-04-06 – 2024-04-23 (×2): qty 28, 28d supply, fill #0

## 2024-04-22 ENCOUNTER — Other Ambulatory Visit (HOSPITAL_BASED_OUTPATIENT_CLINIC_OR_DEPARTMENT_OTHER): Payer: Self-pay

## 2024-04-23 ENCOUNTER — Other Ambulatory Visit (HOSPITAL_BASED_OUTPATIENT_CLINIC_OR_DEPARTMENT_OTHER): Payer: Self-pay

## 2024-04-28 ENCOUNTER — Other Ambulatory Visit (HOSPITAL_BASED_OUTPATIENT_CLINIC_OR_DEPARTMENT_OTHER): Payer: Self-pay

## 2024-04-29 ENCOUNTER — Other Ambulatory Visit (HOSPITAL_BASED_OUTPATIENT_CLINIC_OR_DEPARTMENT_OTHER): Payer: Self-pay

## 2024-04-29 MED ORDER — ALPRAZOLAM 0.25 MG PO TABS
0.2500 mg | ORAL_TABLET | Freq: Three times a day (TID) | ORAL | 0 refills | Status: AC | PRN
  Filled 2024-04-29: qty 30, 10d supply, fill #0
